# Patient Record
Sex: Female | Born: 1980 | Race: Black or African American | Hispanic: No | Marital: Single | Smoking: Never smoker
Health system: Southern US, Community
[De-identification: ages and names within clinical notes are randomized; demographics above are authoritative.]

## PROBLEM LIST (undated history)

## (undated) DIAGNOSIS — D649 Anemia, unspecified: Secondary | ICD-10-CM

## (undated) DIAGNOSIS — E079 Disorder of thyroid, unspecified: Secondary | ICD-10-CM

## (undated) DIAGNOSIS — G8929 Other chronic pain: Secondary | ICD-10-CM

## (undated) DIAGNOSIS — E538 Deficiency of other specified B group vitamins: Secondary | ICD-10-CM

## (undated) DIAGNOSIS — M419 Scoliosis, unspecified: Secondary | ICD-10-CM

## (undated) DIAGNOSIS — M549 Dorsalgia, unspecified: Secondary | ICD-10-CM

## (undated) HISTORY — PX: BACK SURGERY: SHX140

## (undated) HISTORY — DX: Scoliosis, unspecified: M41.9

---

## 2001-01-20 ENCOUNTER — Emergency Department (HOSPITAL_COMMUNITY): Admission: EM | Admit: 2001-01-20 | Discharge: 2001-01-20 | Payer: Self-pay | Admitting: Emergency Medicine

## 2001-01-20 ENCOUNTER — Encounter: Payer: Self-pay | Admitting: Emergency Medicine

## 2002-12-29 ENCOUNTER — Emergency Department (HOSPITAL_COMMUNITY): Admission: EM | Admit: 2002-12-29 | Discharge: 2002-12-29 | Payer: Self-pay | Admitting: Emergency Medicine

## 2002-12-29 ENCOUNTER — Encounter: Payer: Self-pay | Admitting: Emergency Medicine

## 2003-03-29 ENCOUNTER — Emergency Department (HOSPITAL_COMMUNITY): Admission: EM | Admit: 2003-03-29 | Discharge: 2003-03-29 | Payer: Self-pay | Admitting: Emergency Medicine

## 2004-02-12 ENCOUNTER — Other Ambulatory Visit: Admission: RE | Admit: 2004-02-12 | Discharge: 2004-02-12 | Payer: Self-pay | Admitting: Obstetrics and Gynecology

## 2004-05-04 ENCOUNTER — Emergency Department (HOSPITAL_COMMUNITY): Admission: EM | Admit: 2004-05-04 | Discharge: 2004-05-04 | Payer: Self-pay | Admitting: Emergency Medicine

## 2004-05-10 ENCOUNTER — Emergency Department (HOSPITAL_COMMUNITY): Admission: EM | Admit: 2004-05-10 | Discharge: 2004-05-10 | Payer: Self-pay | Admitting: Emergency Medicine

## 2004-05-25 ENCOUNTER — Emergency Department (HOSPITAL_COMMUNITY): Admission: EM | Admit: 2004-05-25 | Discharge: 2004-05-26 | Payer: Self-pay | Admitting: Emergency Medicine

## 2004-08-14 ENCOUNTER — Emergency Department (HOSPITAL_COMMUNITY): Admission: EM | Admit: 2004-08-14 | Discharge: 2004-08-14 | Payer: Self-pay | Admitting: Emergency Medicine

## 2004-10-27 ENCOUNTER — Ambulatory Visit (HOSPITAL_COMMUNITY): Admission: RE | Admit: 2004-10-27 | Discharge: 2004-10-27 | Payer: Self-pay | Admitting: Orthopaedic Surgery

## 2004-10-28 ENCOUNTER — Encounter (HOSPITAL_COMMUNITY): Admission: RE | Admit: 2004-10-28 | Discharge: 2004-11-27 | Payer: Self-pay | Admitting: Orthopaedic Surgery

## 2005-06-20 ENCOUNTER — Emergency Department (HOSPITAL_COMMUNITY): Admission: EM | Admit: 2005-06-20 | Discharge: 2005-06-20 | Payer: Self-pay | Admitting: Emergency Medicine

## 2005-09-21 ENCOUNTER — Emergency Department (HOSPITAL_COMMUNITY): Admission: EM | Admit: 2005-09-21 | Discharge: 2005-09-21 | Payer: Self-pay | Admitting: Emergency Medicine

## 2005-09-29 ENCOUNTER — Emergency Department (HOSPITAL_COMMUNITY): Admission: EM | Admit: 2005-09-29 | Discharge: 2005-09-29 | Payer: Self-pay | Admitting: Emergency Medicine

## 2006-04-01 ENCOUNTER — Encounter (HOSPITAL_COMMUNITY): Admission: RE | Admit: 2006-04-01 | Discharge: 2006-05-01 | Payer: Self-pay | Admitting: Family Medicine

## 2006-04-29 ENCOUNTER — Emergency Department (HOSPITAL_COMMUNITY): Admission: EM | Admit: 2006-04-29 | Discharge: 2006-04-29 | Payer: Self-pay | Admitting: Emergency Medicine

## 2006-05-20 ENCOUNTER — Encounter (HOSPITAL_COMMUNITY): Admission: RE | Admit: 2006-05-20 | Discharge: 2006-06-19 | Payer: Self-pay | Admitting: Family Medicine

## 2006-08-31 ENCOUNTER — Encounter (HOSPITAL_COMMUNITY): Admission: RE | Admit: 2006-08-31 | Discharge: 2006-09-30 | Payer: Self-pay | Admitting: Orthopaedic Surgery

## 2006-10-01 ENCOUNTER — Encounter (HOSPITAL_COMMUNITY): Admission: RE | Admit: 2006-10-01 | Discharge: 2006-10-31 | Payer: Self-pay | Admitting: Orthopaedic Surgery

## 2006-11-05 ENCOUNTER — Emergency Department (HOSPITAL_COMMUNITY): Admission: EM | Admit: 2006-11-05 | Discharge: 2006-11-05 | Payer: Self-pay | Admitting: Emergency Medicine

## 2007-06-02 ENCOUNTER — Encounter (HOSPITAL_COMMUNITY): Admission: RE | Admit: 2007-06-02 | Discharge: 2007-07-02 | Payer: Self-pay | Admitting: Family Medicine

## 2007-08-23 ENCOUNTER — Emergency Department (HOSPITAL_COMMUNITY): Admission: EM | Admit: 2007-08-23 | Discharge: 2007-08-23 | Payer: Self-pay | Admitting: Physician Assistant

## 2008-11-11 ENCOUNTER — Emergency Department (HOSPITAL_COMMUNITY): Admission: EM | Admit: 2008-11-11 | Discharge: 2008-11-11 | Payer: Self-pay | Admitting: Emergency Medicine

## 2008-12-10 ENCOUNTER — Emergency Department (HOSPITAL_COMMUNITY): Admission: EM | Admit: 2008-12-10 | Discharge: 2008-12-10 | Payer: Self-pay | Admitting: Emergency Medicine

## 2008-12-11 ENCOUNTER — Emergency Department (HOSPITAL_COMMUNITY): Admission: EM | Admit: 2008-12-11 | Discharge: 2008-12-11 | Payer: Self-pay | Admitting: Emergency Medicine

## 2009-03-12 ENCOUNTER — Emergency Department (HOSPITAL_COMMUNITY): Admission: EM | Admit: 2009-03-12 | Discharge: 2009-03-12 | Payer: Self-pay | Admitting: Emergency Medicine

## 2009-09-23 ENCOUNTER — Emergency Department (HOSPITAL_COMMUNITY): Admission: EM | Admit: 2009-09-23 | Discharge: 2009-09-23 | Payer: Self-pay | Admitting: Emergency Medicine

## 2009-11-15 ENCOUNTER — Emergency Department (HOSPITAL_COMMUNITY): Admission: EM | Admit: 2009-11-15 | Discharge: 2009-11-15 | Payer: Self-pay | Admitting: Emergency Medicine

## 2010-06-07 ENCOUNTER — Emergency Department (HOSPITAL_COMMUNITY)
Admission: EM | Admit: 2010-06-07 | Discharge: 2010-06-07 | Payer: Self-pay | Source: Home / Self Care | Admitting: Emergency Medicine

## 2010-08-09 ENCOUNTER — Emergency Department (HOSPITAL_COMMUNITY)
Admission: EM | Admit: 2010-08-09 | Discharge: 2010-08-09 | Disposition: A | Discharge status: Home / Self Care | Payer: Medicaid Other | Attending: Emergency Medicine | Admitting: Emergency Medicine

## 2010-08-09 DIAGNOSIS — H00019 Hordeolum externum unspecified eye, unspecified eyelid: Secondary | ICD-10-CM | POA: Insufficient documentation

## 2010-08-09 DIAGNOSIS — H5789 Other specified disorders of eye and adnexa: Secondary | ICD-10-CM | POA: Insufficient documentation

## 2010-08-24 LAB — URINALYSIS, ROUTINE W REFLEX MICROSCOPIC
Protein, ur: NEGATIVE mg/dL
Specific Gravity, Urine: >1.03 — ABNORMAL HIGH (ref 1.005–1.030)
pH: 6 (ref 5.0–8.0)

## 2010-08-24 LAB — DIFFERENTIAL
Eosinophils Relative: 2 % (ref 0–5)
Lymphocytes Relative: 29 % (ref 12–46)

## 2010-08-24 LAB — CBC
HCT: 38.4 % (ref 36.0–46.0)
Hemoglobin: 13 g/dL (ref 12.0–15.0)
MCV: 86.6 fL (ref 78.0–100.0)
WBC: 6.2 10*3/uL (ref 4.0–10.5)

## 2010-08-24 LAB — COMPREHENSIVE METABOLIC PANEL
ALT: <8 U/L (ref 0–35)
AST: 13 U/L (ref 0–37)
Alkaline Phosphatase: 69 U/L (ref 39–117)
BUN: 9 mg/dL (ref 6–23)
Calcium: 8.8 mg/dL (ref 8.4–10.5)
Chloride: 109 mEq/L (ref 96–112)
GFR calc Af Amer: >60 mL/min (ref >60)
Potassium: 3.8 mEq/L (ref 3.5–5.1)
Sodium: 140 mEq/L (ref 135–145)
Total Bilirubin: 0.3 mg/dL (ref 0.3–1.2)
Total Protein: 6.4 g/dL (ref 6.0–8.3)

## 2010-08-24 LAB — PREGNANCY, URINE: Preg Test, Ur: NEGATIVE

## 2010-08-24 LAB — URINE CULTURE

## 2010-08-24 LAB — URINE MICROSCOPIC-ADD ON

## 2010-08-25 LAB — DIFFERENTIAL
Basophils Absolute: 0 10*3/uL (ref 0.0–0.1)
Basophils Relative: 0 % (ref 0–1)
Eosinophils Relative: 1 % (ref 0–5)
Lymphocytes Relative: 46 % (ref 12–46)
Lymphs Abs: 2.9 10*3/uL (ref 0.7–4.0)
Neutro Abs: 3.1 10*3/uL (ref 1.7–7.7)
Neutrophils Relative %: 48 % (ref 43–77)

## 2010-08-25 LAB — URINE MICROSCOPIC-ADD ON

## 2010-08-25 LAB — URINALYSIS, ROUTINE W REFLEX MICROSCOPIC
Nitrite: NEGATIVE
Specific Gravity, Urine: 1.02 (ref 1.005–1.030)
Urobilinogen, UA: 2 mg/dL — ABNORMAL HIGH (ref 0.0–1.0)
pH: 6.5 (ref 5.0–8.0)

## 2010-08-25 LAB — WET PREP, GENITAL
Trich, Wet Prep: NONE SEEN
Yeast Wet Prep HPF POC: NONE SEEN

## 2010-08-25 LAB — CBC
Platelets: 224 10*3/uL (ref 150–400)
WBC: 6.4 10*3/uL (ref 4.0–10.5)

## 2010-08-25 LAB — GC/CHLAMYDIA PROBE AMP, GENITAL: Chlamydia, DNA Probe: NEGATIVE

## 2010-09-06 ENCOUNTER — Emergency Department (HOSPITAL_COMMUNITY)
Admission: EM | Admit: 2010-09-06 | Discharge: 2010-09-06 | Disposition: A | Discharge status: Home / Self Care | Payer: Medicaid Other | Attending: Emergency Medicine | Admitting: Emergency Medicine

## 2010-09-06 DIAGNOSIS — T148XXA Other injury of unspecified body region, initial encounter: Secondary | ICD-10-CM | POA: Insufficient documentation

## 2010-09-06 DIAGNOSIS — M4120 Other idiopathic scoliosis, site unspecified: Secondary | ICD-10-CM | POA: Insufficient documentation

## 2010-09-06 DIAGNOSIS — M25519 Pain in unspecified shoulder: Secondary | ICD-10-CM | POA: Insufficient documentation

## 2010-09-06 DIAGNOSIS — M542 Cervicalgia: Secondary | ICD-10-CM | POA: Insufficient documentation

## 2010-09-06 DIAGNOSIS — R109 Unspecified abdominal pain: Secondary | ICD-10-CM | POA: Insufficient documentation

## 2010-09-06 DIAGNOSIS — Y92410 Unspecified street and highway as the place of occurrence of the external cause: Secondary | ICD-10-CM | POA: Insufficient documentation

## 2010-09-06 LAB — URINALYSIS, ROUTINE W REFLEX MICROSCOPIC
Glucose, UA: NEGATIVE mg/dL
Nitrite: NEGATIVE
Protein, ur: NEGATIVE mg/dL
Urobilinogen, UA: 1 mg/dL (ref 0.0–1.0)

## 2010-09-06 LAB — URINE MICROSCOPIC-ADD ON

## 2011-02-10 LAB — CBC
Platelets: 259
RBC: 4.89
WBC: 6.3

## 2011-02-10 LAB — RAPID STREP SCREEN (MED CTR MEBANE ONLY): Streptococcus, Group A Screen (Direct): NEGATIVE

## 2011-04-23 ENCOUNTER — Emergency Department (HOSPITAL_COMMUNITY)
Admission: EM | Admit: 2011-04-23 | Discharge: 2011-04-23 | Disposition: A | Discharge status: Home / Self Care | Payer: Medicaid Other | Attending: Emergency Medicine | Admitting: Emergency Medicine

## 2011-04-23 ENCOUNTER — Encounter: Payer: Self-pay | Admitting: *Deleted

## 2011-04-23 DIAGNOSIS — B349 Viral infection, unspecified: Secondary | ICD-10-CM

## 2011-04-23 DIAGNOSIS — B9789 Other viral agents as the cause of diseases classified elsewhere: Secondary | ICD-10-CM | POA: Insufficient documentation

## 2011-04-23 HISTORY — DX: Anemia, unspecified: D64.9

## 2011-04-23 HISTORY — DX: Deficiency of other specified B group vitamins: E53.8

## 2011-04-23 NOTE — ED Notes (Signed)
Pt c/o flu like sx x 3 days 

## 2011-04-23 NOTE — ED Provider Notes (Signed)
History    This chart was scribed for Joya Gaskins, MD, MD by Smitty Pluck. The patient was seen in room Room/bed info not found and the patient's care was started at 4:38PM.   CSN: [id number] Arrival date & time: No admission date for patient encounter.   First MD Initiated Contact with Patient 04/23/11 1636      Chief Complaint  Patient presents with  . Generalized Body Aches  . Cough     Patient is a 30 y.o. female presenting with cough. The history is provided by the patient.  Cough   Ana Duncan is a 30 y.o. female who presents to the Emergency Department complaining of persistent moderate overall body aches with associated symptoms of fever, and cough onset 3 days. Pt has taken Nyquil for cough and fever.Pt reports not having flu shot. HPI ELEMENTS:  Location: head, abdomen  Onset: 3 days ago  Timing: constant  Quality: moderate    Associated symptoms: fever, cough     Past Medical History  Diagnosis Date  . Anemia   . Vitamin B 12 deficiency     Past Surgical History  Procedure Date  . Back surgery     History reviewed. No pertinent family history.  History  Substance Use Topics  . Smoking status: Never Smoker   . Smokeless tobacco: Not on file  . Alcohol Use: No    OB History    Grav Para Term Preterm Abortions TAB SAB Ect Mult Living                  Review of Systems  Respiratory: Positive for cough.   All other systems reviewed and are negative.   10 Systems reviewed and are negative for acute change except as noted in the HPI.  Allergies  Review of patient's allergies indicates no known allergies.  Home Medications  No current outpatient prescriptions on file.  BP 113/71  Pulse 109  Temp(Src) 99.8 F (37.7 C) (Oral)  Resp 20  Ht 5\' 5"  (1.651 m)  Wt 150 lb (68.04 kg)  BMI 24.96 kg/m2  SpO2 99%  Physical Exam  Nursing note and vitals reviewed.  CONSTITUTIONAL: Well developed/well nourished HEAD AND FACE:  Normocephalic/atraumatic EYES: EOMI/PERRL ENMT: Mucous membranes moist NECK: supple no meningeal signs SPINE:entire spine nontender CV: S1/S2 noted, no murmurs/rubs/gallops noted LUNGS: Lungs are clear to auscultation bilaterally, no apparent distress ABDOMEN: soft, nontender, no rebound or guarding GU:no cva tenderness NEURO: Pt is awake/alert, moves all extremitiesx4 EXTREMITIES: pulses normal, full ROM SKIN: warm, color normal PSYCH: no abnormalities of mood noted  ED Course  Procedures   DIAGNOSTIC STUDIES: Oxygen Saturation is 99% on room air, normal by my interpretation.    COORDINATION OF CARE:       MDM  Nursing notes reviewed and considered in documentation       I personally performed the services described in this documentation, which was scribed in my presence. The recorded information has been reviewed and considered.      Joya Gaskins, MD 04/23/11 [phone]

## 2011-07-29 ENCOUNTER — Encounter (HOSPITAL_COMMUNITY): Payer: Self-pay | Admitting: *Deleted

## 2011-07-29 ENCOUNTER — Emergency Department (HOSPITAL_COMMUNITY)
Admission: EM | Admit: 2011-07-29 | Discharge: 2011-07-30 | Disposition: A | Discharge status: Home / Self Care | Payer: Medicaid Other | Attending: Emergency Medicine | Admitting: Emergency Medicine

## 2011-07-29 ENCOUNTER — Emergency Department (HOSPITAL_COMMUNITY): Payer: Medicaid Other

## 2011-07-29 DIAGNOSIS — E079 Disorder of thyroid, unspecified: Secondary | ICD-10-CM | POA: Insufficient documentation

## 2011-07-29 DIAGNOSIS — R220 Localized swelling, mass and lump, head: Secondary | ICD-10-CM | POA: Insufficient documentation

## 2011-07-29 LAB — CBC
Hemoglobin: 12.8 g/dL (ref 12.0–15.0)
MCV: 81.8 fL (ref 78.0–100.0)
Platelets: 231 10*3/uL (ref 150–400)
RDW: 12.7 % (ref 11.5–15.5)

## 2011-07-29 LAB — BASIC METABOLIC PANEL
BUN: 10 mg/dL (ref 6–23)
Chloride: 102 mEq/L (ref 96–112)
Creatinine, Ser: 0.78 mg/dL (ref 0.50–1.10)
GFR calc non Af Amer: >90 mL/min (ref >90)
Glucose, Bld: 93 mg/dL (ref 70–99)

## 2011-07-29 LAB — DIFFERENTIAL
Basophils Absolute: 0 10*3/uL (ref 0.0–0.1)
Basophils Relative: 0 % (ref 0–1)
Eosinophils Absolute: 0.1 10*3/uL (ref 0.0–0.7)
Eosinophils Relative: 1 % (ref 0–5)
Lymphocytes Relative: 45 % (ref 12–46)
Monocytes Absolute: 0.3 10*3/uL (ref 0.1–1.0)
Monocytes Relative: 4 % (ref 3–12)
Neutro Abs: 4.2 10*3/uL (ref 1.7–7.7)

## 2011-07-29 LAB — POCT PREGNANCY, URINE: Preg Test, Ur: NEGATIVE

## 2011-07-29 MED ORDER — IOHEXOL 300 MG/ML  SOLN
80.0000 mL | Freq: Once | INTRAMUSCULAR | Status: AC | PRN
  Administered 2011-07-29: 80 mL via INTRAVENOUS

## 2011-07-29 NOTE — ED Notes (Signed)
Ant neck swelling with pain.  No injury

## 2011-07-29 NOTE — ED Provider Notes (Signed)
This chart was scribed for Benny Lennert, MD by Williemae Natter. The patient was seen in room APA01/APA01 at 10:07 PM.  CSN: [id number]  Arrival date & time 07/29/11  2128   First MD Initiated Contact with Patient 07/29/11 2205      Chief Complaint  Patient presents with  . Neck Pain    (Consider location/radiation/quality/duration/timing/severity/associated sxs/prior treatment) Patient is a 31 y.o. female presenting with neck pain. The history is provided by the patient.  Neck Pain  This is a new problem. The current episode started 3 to 5 hours ago. The problem occurs constantly. The problem has not changed since onset.The pain is associated with nothing. There has been no fever. The pain is present in the right side. The pain does not radiate. The pain is moderate. The symptoms are aggravated by bending. Pertinent negatives include no chest pain and no headaches. She has tried nothing for the symptoms.   Ana Duncan is a 31 y.o. female who presents to the Emergency Department complaining of neck pain. Pt has a knot on neck that has been swelling up for a few hours. Pt is in moderate pain. No known injury. Past Medical History  Diagnosis Date  . Anemia   . Vitamin B 12 deficiency     Past Surgical History  Procedure Date  . Back surgery     Family History  Problem Relation Age of Onset  . Diabetes Mother   . Hypertension Mother     History  Substance Use Topics  . Smoking status: Never Smoker   . Smokeless tobacco: Not on file  . Alcohol Use: No    OB History    Grav Para Term Preterm Abortions TAB SAB Ect Mult Living                  Review of Systems  Constitutional: Negative for fatigue.  HENT: Positive for neck pain. Negative for congestion, sinus pressure and ear discharge.   Eyes: Negative for discharge.  Respiratory: Negative for cough.   Cardiovascular: Negative for chest pain.  Gastrointestinal: Negative for abdominal pain and diarrhea.    Genitourinary: Negative for frequency and hematuria.  Musculoskeletal: Negative for back pain.  Skin: Negative for rash.  Neurological: Negative for seizures and headaches.  Hematological: Negative.   Psychiatric/Behavioral: Negative for hallucinations.    Allergies  Review of patient's allergies indicates no known allergies.  Home Medications  No current outpatient prescriptions on file.  BP 127/63  Pulse 73  Temp(Src) 97.9 F (36.6 C) (Oral)  Resp 20  Ht 5\' 5"  (1.651 m)  Wt 150 lb (68.04 kg)  BMI 24.96 kg/m2  SpO2 100%  Physical Exam  Nursing note and vitals reviewed. Constitutional: She is oriented to person, place, and time. She appears well-developed.  HENT:  Head: Normocephalic and atraumatic.       Mass on right anterior neck 1 cm. Some associated tenderness.  Eyes: Conjunctivae and EOM are normal. No scleral icterus.  Neck: Neck supple. No thyromegaly present.  Cardiovascular: Normal rate and regular rhythm.  Exam reveals no gallop and no friction rub.   No murmur heard. Pulmonary/Chest: No stridor. She has no wheezes. She has no rales. She exhibits no tenderness.  Abdominal: She exhibits no distension. There is no tenderness. There is no rebound.  Musculoskeletal: Normal range of motion. She exhibits no edema.  Lymphadenopathy:    She has no cervical adenopathy.  Neurological: She is oriented to person, place, and time.  Coordination normal.  Skin: No rash noted. No erythema.  Psychiatric: She has a normal mood and affect. Her behavior is normal.    ED Course  Procedures (including critical care time) DIAGNOSTIC STUDIES: Oxygen Saturation is 100% on room air, normal by my interpretation.    COORDINATION OF CARE: Medications - No data to display    Labs Reviewed - No data to display No results found.   No diagnosis found.    MDM   The chart was scribed for me under my direct supervision.  I personally performed the history, physical, and  medical decision making and all procedures in the evaluation of this patient..       Dr. Hyacinth Meeker to follow up on neck ct  Benny Lennert, MD 07/29/11 2303

## 2011-07-30 NOTE — Discharge Instructions (Signed)
Your CT scan shows that you have a large mass on your thyroid. This needs to have a biopsy to evaluate why it is there and what it is. Please call your Dr. in the morning to schedule this appointment, you must be seen within one to 2 weeks for this biopsy, return to the emergency department for any difficulty breathing, swelling or swallowing.  RESOURCE GUIDE  Dental Problems  Patients with Medicaid: [facility] [phone]                                           [phone] [address] [employer] Phone:  [phone]                                                  Phone:  [phone]  If unable to pay or uninsured, contact:  [facility] or [facility]. to become qualified for the adult dental clinic.  Chronic Pain Problems Contact [facility]  [phone] Patients need to be referred by their primary care doctor.  Insufficient Money for Medicine Contact [employer]:  call "211" or [facility] [phone].  No Primary Care Doctor Call [facility]  [phone] Other agencies that provide inexpensive medical care    [facility]  [phone]    [facility]  [phone]    [facility]  [phone]    [facility]  [phone]    [facility]  [phone]    [facility]  [phone]  Psychological Services [facility]  [phone] [facility]  [phone] [facility]   [phone] (emergency services [phone])  Substance Abuse Resources [facility]  [phone] [facility] [phone] [facility] [phone] Floyd[facility] Flock [phone] Residential & Outpatient Substance Abuse Program  [phone]  Abuse/Neglect [facility] Child Abuse Hotline 763-358-3227 [facility] Child Abuse Hotline 775-165-9383 (After Hours)  Emergency Shelter [facility] [phone]  Maternity Homes [facility] [place] [facility] [phone] [facility] [phone]  MRSA Hotline #:   507-084-5966    [facility] Resources  [facility] [place]     [employer]                          [facility] Dept. [address] [place]                       [address]      [address]  [place]                                                Cristobal Goldmann Phone:  [phone]                                   Phone:  [phone]                 Phone:  [phone]  [facility] Phone:  [phone]  [facility] Child Abuse Hotline [phone] [phone] (After Hours)

## 2011-07-30 NOTE — ED Provider Notes (Signed)
  Physical Exam  BP 111/64  Pulse 72  Temp(Src) 98.4 F (36.9 C) (Oral)  Resp 20  Ht 5\' 5"  (1.651 m)  Wt 150 lb (68.04 kg)  BMI 24.96 kg/m2  SpO2 100%  LMP 07/13/2011  Physical Exam  ED Course  Procedures  MDM Change of shift, vital signs normal, large thyroid mass, patient informed of same, will discharge home with followup instructions, no airway compromise at this time      Vida Roller, MD 07/30/11 0109

## 2011-08-03 ENCOUNTER — Other Ambulatory Visit (HOSPITAL_COMMUNITY): Payer: Self-pay | Admitting: "Endocrinology

## 2011-08-03 DIAGNOSIS — E049 Nontoxic goiter, unspecified: Secondary | ICD-10-CM

## 2011-08-06 ENCOUNTER — Other Ambulatory Visit (HOSPITAL_COMMUNITY): Payer: Self-pay | Admitting: "Endocrinology

## 2011-08-06 ENCOUNTER — Ambulatory Visit (HOSPITAL_COMMUNITY)
Admission: RE | Admit: 2011-08-06 | Discharge: 2011-08-06 | Disposition: A | Discharge status: Home / Self Care | Payer: Medicaid Other | Source: Ambulatory Visit | Attending: "Endocrinology | Admitting: "Endocrinology

## 2011-08-06 DIAGNOSIS — E049 Nontoxic goiter, unspecified: Secondary | ICD-10-CM | POA: Insufficient documentation

## 2011-08-06 NOTE — Progress Notes (Signed)
Lidocaine 2%          4mL injected 

## 2011-08-06 NOTE — Discharge Instructions (Signed)
Thyroid Biopsy The thyroid gland is a butterfly-shaped gland situated in the front of the neck. It produces hormones which affect metabolism, growth and development, and body temperature. A thyroid biopsy is a procedure in which small samples of tissue or fluid are removed from the thyroid gland or mass and examined under a microscope. This test is done to determine the cause of thyroid problems, such as infection, cancer, or other thyroid problems. There are 2 ways to obtain samples: 1. Fine needle biopsy. Samples are removed using a thin needle inserted through the skin and into the thyroid gland or mass.  2. Open biopsy. Samples are removed after a cut (incision) is made through the skin.  LET YOUR CAREGIVER KNOW ABOUT:   Allergies.   Medications taken including herbs, eye drops, over-the-counter medications, and creams.   Use of steroids (by mouth or creams).   Previous problems with anesthetics or numbing medicine.   Possibility of pregnancy, if this applies.   History of blood clots (thrombophlebitis).   History of bleeding or blood problems.   Previous surgery.   Other health problems.  RISKS AND COMPLICATIONS  Bleeding from the site. The risk of bleeding is higher if you have a bleeding disorder or are taking any blood thinning medications (anticoagulants).   Infection.   Injury to structures near the thyroid gland.  BEFORE THE PROCEDURE  This is a procedure that can be done as an outpatient. Confirm the time that you need to arrive for your procedure. Confirm whether there is a need to fast or withhold any medications. A blood sample may be done to determine your blood clotting time. Medicine may be given to help you relax (sedative). PROCEDURE Fine needle biopsy. You will be awake during the procedure. You may be asked to lie on your back with your head tipped backward to extend your neck. Let your caregiver know if you cannot tolerate the positioning. An area on your  neck will be cleansed. A needle is inserted through the skin of your neck. You may feel a mild discomfort during this procedure. You may be asked to avoid coughing, talking, swallowing, or making sounds during some portions of the procedure. The needle is withdrawn once tissue or fluid samples have been removed. Pressure may be applied to the neck to reduce swelling and ensure that bleeding has stopped. The samples will be sent for examination.  Open biopsy. You will be given general anesthesia. You will be asleep during the procedure. An incision is made in your neck. A sample of thyroid tissue or the mass is removed. The tissue sample or mass will be sent for examination. The sample or mass may be examined during the biopsy. If the sample or mass contains cancer cells, some or all of the thyroid gland may be removed. The incision is closed with stitches. AFTER THE PROCEDURE  Your recovery will be assessed and monitored. If there are no problems, as an outpatient, you should be able to go home shortly after the procedure. If you had a fine needle biopsy:  You may have soreness at the biopsy site for 1 to 2 days.  If you had an open biopsy:   You may have soreness at the biopsy site for 3 to 4 days.   You may have a hoarse voice or sore throat for 1 to 2 days.  Obtaining the Test Results It is your responsibility to obtain your test results. Do not assume everything is normal if you have   not heard from your caregiver or the medical facility. It is important for you to follow up on all of your test results. HOME CARE INSTRUCTIONS   Keeping your head raised on a pillow when you are lying down may ease biopsy site discomfort.   Supporting the back of your head and neck with both hands as you sit up from a lying position may ease biopsy site discomfort.   Only take over-the-counter or prescription medicines for pain, discomfort, or fever as directed by your caregiver.   Throat lozenges or gargling  with warm salt water may help to soothe a sore throat.  SEEK IMMEDIATE MEDICAL CARE IF:   You have severe bleeding from the biopsy site.   You have difficulty swallowing.   You have a fever.   You have increased pain, swelling, redness, or warmth at the biopsy site.   You notice pus coming from the biopsy site.   You have swollen glands (lymph nodes) in your neck.  Document Released: 03/01/2007 Document Revised: 04/23/2011 Document Reviewed: 08/01/2008 ExitCare Patient Information 2012 [employer]. 

## 2011-08-06 NOTE — Procedures (Signed)
ULTRASOUND GUIDED NEEDLE ASPIRATE BIOPSY OF THE THYROID GLAND Comparison: [Today's ultrasound and the CT of 07/29/2011.] Thyroid biopsy was thoroughly discussed with the patient and questions were answered.  The benefits, risks, alternatives, and complications were also discussed.  The patient understands and wishes to proceed with the procedure.  Written consent was obtained.    Ultrasound was performed to localize and mark an adequate site for the biopsy.  The patient was then prepped and draped in a normal sterile fashion.  Local anesthesia was provided with 1% lidocaine.  Using direct ultrasound guidance, [4] passes were made using [25  guage] needles into the nodule within the [right lobe/isthmus] of the thyroid.  Ultrasound was used to confirm needle placements on all occasions.  Specimens were sent to Pathology for analysis. Complications:  [None] Findings:  [Dominant right sided mass.] IMPRESSION: Ultrasound guided needle aspirate biopsy performed of the [right lobe/isthmic] thyroid nodule.

## 2011-08-15 ENCOUNTER — Encounter (HOSPITAL_COMMUNITY): Payer: Self-pay | Admitting: *Deleted

## 2011-08-15 ENCOUNTER — Emergency Department (HOSPITAL_COMMUNITY)
Admission: EM | Admit: 2011-08-15 | Discharge: 2011-08-15 | Disposition: A | Discharge status: Home / Self Care | Payer: Medicaid Other | Attending: Emergency Medicine | Admitting: Emergency Medicine

## 2011-08-15 DIAGNOSIS — N390 Urinary tract infection, site not specified: Secondary | ICD-10-CM

## 2011-08-15 DIAGNOSIS — D649 Anemia, unspecified: Secondary | ICD-10-CM | POA: Insufficient documentation

## 2011-08-15 DIAGNOSIS — E538 Deficiency of other specified B group vitamins: Secondary | ICD-10-CM | POA: Insufficient documentation

## 2011-08-15 LAB — URINALYSIS, ROUTINE W REFLEX MICROSCOPIC
Bilirubin Urine: NEGATIVE
Leukocytes, UA: NEGATIVE
Nitrite: NEGATIVE
Specific Gravity, Urine: >1.03 — ABNORMAL HIGH (ref 1.005–1.030)
pH: 5.5 (ref 5.0–8.0)

## 2011-08-15 LAB — URINE MICROSCOPIC-ADD ON

## 2011-08-15 LAB — PREGNANCY, URINE: Preg Test, Ur: NEGATIVE

## 2011-08-15 MED ORDER — OXYCODONE-ACETAMINOPHEN 5-325 MG PO TABS
1.0000 | ORAL_TABLET | Freq: Once | ORAL | Status: AC
  Administered 2011-08-15: 1 via ORAL
  Filled 2011-08-15: qty 1

## 2011-08-15 MED ORDER — IBUPROFEN 600 MG PO TABS: 600.0000 mg | ORAL_TABLET | Freq: Three times a day (TID) | ORAL | Status: AC | PRN

## 2011-08-15 MED ORDER — CEPHALEXIN 500 MG PO CAPS
500.0000 mg | ORAL_CAPSULE | Freq: Once | ORAL | Status: AC
  Administered 2011-08-15: 500 mg via ORAL
  Filled 2011-08-15: qty 1

## 2011-08-15 MED ORDER — CEPHALEXIN 500 MG PO CAPS: 500.0000 mg | ORAL_CAPSULE | Freq: Four times a day (QID) | ORAL | Status: DC

## 2011-08-15 NOTE — ED Notes (Signed)
Pt c/o lower abdominal pain x 1 day. No n/v or uti symptoms

## 2011-08-15 NOTE — ED Provider Notes (Signed)
History     CSN: [id number]  Arrival date & time 08/15/11  0229   First MD Initiated Contact with Patient 08/15/11 [phone]      Chief Complaint  Patient presents with  . Abdominal Pain    The history is provided by the patient.   the patient reports approximately 18 hours of suprapubic abdominal pain without nausea vomiting or diarrhea.  She denies dysuria urinary frequency.  She does report a small tinge of "pink" on the tissue after wiping upon completion of urination.  She did do sit-ups 3 days ago which is something new for her.  Nothing worsens her symptoms.  Nothing improves her symptoms.  Her symptoms are constant.  Past Medical History  Diagnosis Date  . Anemia   . Vitamin B 12 deficiency     Past Surgical History  Procedure Date  . Back surgery     Family History  Problem Relation Age of Onset  . Diabetes Mother   . Hypertension Mother     History  Substance Use Topics  . Smoking status: Never Smoker   . Smokeless tobacco: Not on file  . Alcohol Use: No    OB History    Grav Para Term Preterm Abortions TAB SAB Ect Mult Living                  Review of Systems  Gastrointestinal: Positive for abdominal pain.  All other systems reviewed and are negative.    Allergies  Review of patient's allergies indicates no known allergies.  Home Medications   Current Outpatient Rx  Name Route Sig Dispense Refill  . VITAMIN B-12 IJ Injection Inject 1 each as directed every 30 (thirty) days.    . CYCLOBENZAPRINE HCL 10 MG PO TABS Oral Take 10 mg by mouth daily as needed. For muscle spasms    . HYDROCODONE-ACETAMINOPHEN 7.5-325 MG PO TABS Oral Take 1 tablet by mouth every 6 (six) hours as needed. For pain    . CEPHALEXIN 500 MG PO CAPS Oral Take 1 capsule (500 mg total) by mouth 4 (four) times daily. 28 capsule 0  . IBUPROFEN 600 MG PO TABS Oral Take 1 tablet (600 mg total) by mouth every 8 (eight) hours as needed for pain. 15 tablet 0    BP 129/66  Pulse 87   Temp 98.8 F (37.1 C)  Resp 20  Ht 5\' 5"  (1.651 m)  Wt 145 lb (65.772 kg)  BMI 24.13 kg/m2  SpO2 100%  LMP 07/13/2011  Physical Exam  Nursing note and vitals reviewed. Constitutional: She is oriented to person, place, and time. She appears well-developed and well-nourished. No distress.  HENT:  Head: Normocephalic and atraumatic.  Eyes: EOM are normal.  Neck: Normal range of motion.  Cardiovascular: Normal rate, regular rhythm and normal heart sounds.   Pulmonary/Chest: Effort normal and breath sounds normal.  Abdominal: Soft. She exhibits no distension. There is tenderness.       Mild suprapubic tenderness without guarding or rebound.  No tenderness in the right lower quadrant  Musculoskeletal: Normal range of motion.  Neurological: She is alert and oriented to person, place, and time.  Skin: Skin is warm and dry.  Psychiatric: She has a normal mood and affect. Judgment normal.    ED Course  Procedures (including critical care time)  Labs Reviewed  URINALYSIS, ROUTINE W REFLEX MICROSCOPIC - Abnormal; Notable for the following:    Specific Gravity, Urine >1.030 (*)    Hgb urine dipstick  TRACE (*)    Protein, ur TRACE (*)    All other components within normal limits  URINE MICROSCOPIC-ADD ON - Abnormal; Notable for the following:    Squamous Epithelial / LPF MANY (*)    Bacteria, UA FEW (*)    All other components within normal limits  PREGNANCY, URINE   No results found.   1. Abdominal pain   2. Urinary tract infection       MDM  This may represent urinary tract infection with suprapubic tenderness.  Urine will be covered.  DC home with pain medicine.  I don't believe this is an early appendicitis however the family was given the possibility that this could be the presentation and then the standard return to the ER for new or worsening symptoms.  This may be simple musculoskeletal abdominal pain from her doing so several days ago.  Her abdominal exam is  unimpressive        Lyanne Co, MD 08/15/11 [phone]

## 2011-08-15 NOTE — Discharge Instructions (Signed)
Abdominal Pain  Abdominal pain can be caused by many things. Your caregiver decides the seriousness of your pain by an examination and possibly blood tests and X-rays. Many cases can be observed and treated at home. Most abdominal pain is not caused by a disease and will probably improve without treatment. However, in many cases, more time must pass before a clear cause of the pain can be found. Before that point, it may not be known if you need more testing, or if hospitalization or surgery is needed.  HOME CARE INSTRUCTIONS    Do not take laxatives unless directed by your caregiver.   Take pain medicine only as directed by your caregiver.   Only take over-the-counter or prescription medicines for pain, discomfort, or fever as directed by your caregiver.   Try a clear liquid diet (broth, tea, or water) for as long as directed by your caregiver. Slowly move to a bland diet as tolerated.  SEEK IMMEDIATE MEDICAL CARE IF:    The pain does not go away.   You have a fever.   You keep throwing up (vomiting).   The pain is felt only in portions of the abdomen. Pain in the right side could possibly be appendicitis. In an adult, pain in the left lower portion of the abdomen could be colitis or diverticulitis.   You pass bloody or black tarry stools.  MAKE SURE YOU:    Understand these instructions.   Will watch your condition.   Will get help right away if you are not doing well or get worse.  Document Released: 02/11/2005 Document Revised: 04/23/2011 Document Reviewed: 12/21/2007  ExitCare Patient Information 2012 [employer].      Urinary Tract Infection  Infections of the urinary tract can start in several places. A bladder infection (cystitis), a kidney infection (pyelonephritis), and a prostate infection (prostatitis) are different types of urinary tract infections (UTIs). They usually get better if treated with medicines (antibiotics) that kill germs. Take all the medicine until it is gone. You or your  child may feel better in a few days, but TAKE ALL MEDICINE or the infection may not respond and may become more difficult to treat.  HOME CARE INSTRUCTIONS    Drink enough water and fluids to keep the urine clear or pale yellow. Cranberry juice is especially recommended, in addition to large amounts of water.   Avoid caffeine, tea, and carbonated beverages. They tend to irritate the bladder.   Alcohol may irritate the prostate.   Only take over-the-counter or prescription medicines for pain, discomfort, or fever as directed by your caregiver.  To prevent further infections:   Empty the bladder often. Avoid holding urine for long periods of time.   After a bowel movement, women should cleanse from front to back. Use each tissue only once.   Empty the bladder before and after sexual intercourse.  FINDING OUT THE RESULTS OF YOUR TEST  Not all test results are available during your visit. If your or your child's test results are not back during the visit, make an appointment with your caregiver to find out the results. Do not assume everything is normal if you have not heard from your caregiver or the medical facility. It is important for you to follow up on all test results.  SEEK MEDICAL CARE IF:    There is back pain.   Your baby is older than 3 months with a rectal temperature of 100.5 F (38.1 C) or higher for more than   1 day.   Your or your child's problems (symptoms) are no better in 3 days. Return sooner if you or your child is getting worse.  SEEK IMMEDIATE MEDICAL CARE IF:    There is severe back pain or lower abdominal pain.   You or your child develops chills.   You have a fever.   Your baby is older than 3 months with a rectal temperature of 102 F (38.9 C) or higher.   Your baby is 3 months old or younger with a rectal temperature of 100.4 F (38 C) or higher.   There is nausea or vomiting.   There is continued burning or discomfort with urination.  MAKE SURE YOU:    Understand these  instructions.   Will watch your condition.   Will get help right away if you are not doing well or get worse.  Document Released: 02/11/2005 Document Revised: 04/23/2011 Document Reviewed: 09/16/2006  ExitCare Patient Information 2012 [employer].

## 2011-08-20 ENCOUNTER — Encounter (HOSPITAL_COMMUNITY): Payer: Self-pay | Admitting: Pharmacy Technician

## 2011-08-21 ENCOUNTER — Other Ambulatory Visit (HOSPITAL_COMMUNITY): Payer: Medicaid Other

## 2011-08-23 NOTE — H&P (Signed)
  NTS SOAP Note  Vital Signs:  Vitals as of: 08/18/2011: Systolic 111: Diastolic 76: Heart Rate 76: Temp 97.62F: Height 71ft 5in: Weight 150Lbs 0 Ounces: OFC 0in: Respiratory Rate 0: O2 Saturation 0: Pain Level 0: BMI 25  BMI : 24.96 kg/m2  Subjective: This 31 Years 8 Months old Female presents for of neck swelling.  Patient states has noted increase swelling last couple months.  Her family has told her that her neck has gotten "fat".  Has a pressure sensation in her neck.  Some PO feels as though it gets stuck.  No history of radiation exposure.  No fever or chills.  No family history.  No unusual travel.  No wt changes.  No energy changes.  NO appetite changes.  Review of Symptoms:  Constitutional:unremarkable Head:unremarkable Eyes:unremarkable as per HPI Cardiovascular:unremarkable Respiratory:unremarkable Gastrointestinal:unremarkable Genitourinary:unremarkable Musculoskeletal:unremarkable Skin:unremarkable Breast:unremarkable Hematolgic/Lymphatic:unremarkable Allergic/Immunologic:unremarkable   Past Medical History:Obtained   Past Medical History  Pregnancy Gravida: 1 Pregnancy Para: 1 Surgical History: spinal surgery Medical Problems: scoliosis Psychiatric History: none Allergies: corn Medications: B12 Shot, flexeril, hydrocodone 5/325   Social History:Obtained  Social History  Preferred Language: English ([place]) Race:  Black or African American Ethnicity: Not Hispanic / Latino Age: 31 Years 8 Months Marital Status:  S Alcohol: none Recreational drug(s): none   Smoking Status: Never smoker reviewed on 08/18/2011  Family History:Obtained   Family History  Is there a family history of:  HTN, no history of thyroid disease.    Objective Information: General:Well appearing, well nourished in no distress. Skin:no rash or prominent lesions Head:Atraumatic; no masses; no  abnormalities Eyes:conjunctiva clear, EOM intact, PERRL Mouth:Mucous membranes moist, no mucosal lesions. Neck:Supple without lymphadenopathy.Thyroid enlarged.  Non-tender.  Palpable nodularity.  No tracheal deviation.   Heart:RRR, no murmur Lungs:CTA bilaterally, no wheezes, rhonchi, rales.  Breathing unlabored. Abdomen:Soft, NT/ND, no HSM, no masses. Extremities:No deformities, clubbing, cyanosis, or edema.      Path:  FNA demonstrates atypical follicular cells.   Assessment:  Diagnosis &amp; Procedure: DiagnosisCode: 241.1, ProcedureCode: 47829,    Plan: Multinodular goiter, atypical follicular cells.  Suspicious for malignancy.  Options discussed.  Recommend Total thyroidectomy.  Patient will schedule.  She is getting married end of this week.  Will schedule in near future.  Patient Education:Alternative treatments to surgery were discussed with patient (and family).Risks and benefits  of procedure were fully explained to the patient (and family) who gave informed consent. Patient/family questions were addressed.  Follow-up:Pending Surgery                                             Active Diagnosis and Procedures: 241.1 Enlarged thyroid gland with multiple nodules (lumps)   56213 - OFFICE OUTPATIENT NEW 30 MINUTES        This note has been electronically signed by Fabio Bering MD 08/18/2011 12:21 PM

## 2011-08-24 ENCOUNTER — Encounter (HOSPITAL_COMMUNITY)
Admission: RE | Admit: 2011-08-24 | Discharge: 2011-08-24 | Disposition: A | Discharge status: Home / Self Care | Payer: Medicaid Other | Source: Ambulatory Visit | Attending: General Surgery | Admitting: General Surgery

## 2011-08-24 ENCOUNTER — Encounter (HOSPITAL_COMMUNITY): Payer: Self-pay

## 2011-08-24 LAB — DIFFERENTIAL
Eosinophils Absolute: 0.1 10*3/uL (ref 0.0–0.7)
Lymphs Abs: 2.7 10*3/uL (ref 0.7–4.0)
Monocytes Absolute: 0.3 10*3/uL (ref 0.1–1.0)
Monocytes Relative: 5 % (ref 3–12)
Neutrophils Relative %: 42 % — ABNORMAL LOW (ref 43–77)

## 2011-08-24 LAB — CBC
MCHC: 33.1 g/dL (ref 30.0–36.0)
Platelets: 254 10*3/uL (ref 150–400)
RDW: 12.5 % (ref 11.5–15.5)

## 2011-08-24 LAB — COMPREHENSIVE METABOLIC PANEL
ALT: 7 U/L (ref 0–35)
AST: 11 U/L (ref 0–37)
Albumin: 3.8 g/dL (ref 3.5–5.2)
Alkaline Phosphatase: 64 U/L (ref 39–117)
BUN: 12 mg/dL (ref 6–23)
Potassium: 3.8 mEq/L (ref 3.5–5.1)
Sodium: 140 mEq/L (ref 135–145)
Total Protein: 6.6 g/dL (ref 6.0–8.3)

## 2011-08-24 NOTE — Patient Instructions (Addendum)
20 Ana Duncan  08/24/2011   Your procedure is scheduled on:  Wednesday, April 10  Report to [facility] at [id number].  Call this number if you have problems the morning of surgery: [phone]   Remember:   Do not eat food:After Midnight.  May have clear liquids:until Midnight .  Clear liquids include soda, tea, black coffee, apple or grape juice, broth.  Take these medicines the morning of surgery with A SIP OF WATER: Flexeril, Norco   Do not wear jewelry, make-up or nail polish.  Do not wear lotions, powders, or perfumes. You may wear deodorant.  Do not shave 48 hours prior to surgery.  Do not bring valuables to the hospital.  Contacts, dentures or bridgework may not be worn into surgery.  Leave suitcase in the car. After surgery it may be brought to your room.  For patients admitted to the hospital, checkout time is 11:00 AM the day of discharge.   Patients discharged the day of surgery will not be allowed to drive home.  Name and phone number of your driver: Family  Special Instructions: CHG Shower Use Special Wash: 1/2 bottle night before surgery and 1/2 bottle morning of surgery.   Please read over the following fact sheets that you were given: Pain Booklet, Coughing and Deep Breathing, MRSA Information, Surgical Site Infection Prevention, Anesthesia Post-op Instructions and Care and Recovery After Surgery   Thyroidectomy Thyroidectomy is the removal of part or all of your thyroid gland. Your thyroid gland is a butterfly-shaped gland at the base of your neck. It produces a substance called thyroid hormone, which regulates the physical and chemical processes that keep your body functioning and make energy available to your body (metabolism). The amount of thyroid gland tissue that is removed during a thyroidectomy depends on the reason for the procedure. Typically, if only a part of your gland is removed, enough thyroid gland tissue remains to maintain normal function. If your entire  thyroid gland is removed or if the amount of thyroid gland tissue remaining is inadequate to maintain normal function, you will need life-long treatment with thyroid hormone on a daily basis. Thyroidectomy maybe performed when you have the following conditions:  Thyroid nodules. These are small, abnormal collections of tissue that form inside the thyroid gland. If these nodules begin to enlarge at a rapid rate, a sample of tissue from the nodule is taken through a needle and examined (needle biopsy). This is done to determine if the nodules are cancerous. Depending on the outcome of this exam, thyroidectomy may be necessary.   Thyroid cancer.   Goiter, which is an enlarged thyroid gland. All or part of the thyroid gland may be removed if the gland has become so large that it causes difficulty breathing or swallowing.   Hyperthyroidism. This is when the thyroid gland produces too much thyroid hormone. Hypothyroidism can cause symptoms of fluctuating weight, intolerance to heat, irritability, shortness of breath, and chest pain.  LET YOUR CAREGIVER KNOW ABOUT:   Allergies to food or medicine.   Medicines that you are taking, including vitamins, herbs, eyedrops, over-the-counter medicines, and creams.   Previous problems you have had with anesthetics or numbing medicines.   History of bleeding problems or blood clots.   Previous surgeries you have had.   Other health problems, including diabetes and kidney problems, you have had.   Possibility of pregnancy, if this applies.  BEFORE THE PROCEDURE   Do not eat or drink anything, including water, for  at least 6 hours before the procedure.   Ask your caregiver whether you should stop taking certain medicines before the day of the procedure.  PROCEDURE  There are different ways that thyroidectomy is performed. For each type, you will be given a medicine to make you sleep (general anesthetic). The three main types of thyroidectomy are listed as  follows:  Conventional thyroidectomy-A cut (incision) in the center portion of your lower neck is made with a scalpel. Muscles below your skin are separated to gain access to your thyroid gland. Your thyroid gland is dissected from your windpipe (trachea). Often a drain is placed at the incision site to drain any blood that accumulates under the skin after the procedure. This drain will be removed before you go home. The wound from the incision should heal within 2 weeks.   Endoscopic thyroidectomy-Small incisions are made in your lower neck. A small instrument (endoscope) is inserted under your skin at the incision sites. The endoscope used for thyroidectomy consists of 2 flexible tubes. Inside one of the tubes is a video camera that is used to guide the Careers adviser. Tools to remove the thyroid gland, including a tool to cut the gland (dissectors) and a suction device, are inserted through the other tube. The surgeon uses the dissectors to dissect the thyroid gland from the trachea and remove it.   Robotic thyroidectomy-This procedure allows your thyroid gland to be removed through incisions in your armpit, your chest, or high in your neck. Instruments similar to endoscopes provide a 3-dimensional picture of the surgical site. Dissecting instruments are controlled by devices similar to joysticks. These devices allow more accurate manipulation of the instruments. After the blood supply to the gland is removed, the gland is cut into several pieces and removed through the incisions.  RISKS AND COMPLICATIONS Complications associated with thyroidectomy are rare, but they can occur. Possible complications include:  A decrease in parathyroid hormone levels (hypoparathyroidism)-Your parathyroid glands are located close behind your thyroid gland. They are responsible for maintaining calcium levels inthe body. If they are damaged or removed, levels of calcium in the blood become low and nerves become irritable, which  can cause muscle spasms. Medicines are available to treat this.   Bacterial infection-This can often be treated with medicines that kill bacteria (antibiotics).   Damage to your voice box nerves-This could cause hoarseness or complete loss of voice.   Bleeding or airway obstruction.  AFTER THE PROCEDURE   You will rest in the recovery room as you wake up.   When you first wake up, your throat may feel slightly sore.   You will not be allowed to eat or drink until instructed otherwise.   You will be taken to your hospital room. You will usually stay at the hospital for 1 or 2 nights.   If a drain is placed during the procedure, it usually is removed the next day.   You may have some mild neck pain.   Your voice may be weak. This usually is temporary.  Document Released: 10/28/2000 Document Revised: 04/23/2011 Document Reviewed: 08/06/2010 [facility] Patient Information 2012 [place], [place].   PATIENT INSTRUCTIONS POST-ANESTHESIA  IMMEDIATELY FOLLOWING SURGERY:  Do not drive or operate machinery for the first twenty four hours after surgery.  Do not make any important decisions for twenty four hours after surgery or while taking narcotic pain medications or sedatives.  If you develop intractable nausea and vomiting or a severe headache please notify your doctor immediately.  FOLLOW-UP:  Please make an appointment with your surgeon as instructed. You do not need to follow up with anesthesia unless specifically instructed to do so.  WOUND CARE INSTRUCTIONS (if applicable):  Keep a dry clean dressing on the anesthesia/puncture wound site if there is drainage.  Once the wound has quit draining you may leave it open to air.  Generally you should leave the bandage intact for twenty four hours unless there is drainage.  If the epidural site drains for more than 36-48 hours please call the anesthesia department.  QUESTIONS?:  Please feel free to call your physician or the hospital operator if  you have any questions, and they will be happy to assist you.

## 2011-08-26 ENCOUNTER — Ambulatory Visit (HOSPITAL_COMMUNITY): Payer: Medicaid Other | Admitting: Anesthesiology

## 2011-08-26 ENCOUNTER — Observation Stay (HOSPITAL_COMMUNITY)
Admission: RE | Admit: 2011-08-26 | Discharge: 2011-08-27 | Disposition: A | Discharge status: Home / Self Care | Payer: Medicaid Other | Source: Ambulatory Visit | Attending: General Surgery | Admitting: General Surgery

## 2011-08-26 ENCOUNTER — Encounter (HOSPITAL_COMMUNITY)
Admission: RE | Disposition: A | Discharge status: Home / Self Care | Payer: Self-pay | Source: Ambulatory Visit | Attending: General Surgery

## 2011-08-26 ENCOUNTER — Encounter (HOSPITAL_COMMUNITY): Payer: Self-pay | Admitting: *Deleted

## 2011-08-26 ENCOUNTER — Encounter (HOSPITAL_COMMUNITY): Payer: Self-pay | Admitting: Anesthesiology

## 2011-08-26 DIAGNOSIS — Z01812 Encounter for preprocedural laboratory examination: Secondary | ICD-10-CM | POA: Insufficient documentation

## 2011-08-26 DIAGNOSIS — E042 Nontoxic multinodular goiter: Principal | ICD-10-CM | POA: Insufficient documentation

## 2011-08-26 DIAGNOSIS — Z9889 Other specified postprocedural states: Secondary | ICD-10-CM

## 2011-08-26 DIAGNOSIS — E049 Nontoxic goiter, unspecified: Secondary | ICD-10-CM | POA: Insufficient documentation

## 2011-08-26 HISTORY — PX: THYROIDECTOMY: SHX17

## 2011-08-26 LAB — CBC
MCH: 27.8 pg (ref 26.0–34.0)
MCHC: 33.7 g/dL (ref 30.0–36.0)
MCV: 82.4 fL (ref 78.0–100.0)
Platelets: 246 10*3/uL (ref 150–400)
RBC: 4.03 MIL/uL (ref 3.87–5.11)
RDW: 12.4 % (ref 11.5–15.5)

## 2011-08-26 LAB — CREATININE, SERUM: Creatinine, Ser: 0.79 mg/dL (ref 0.50–1.10)

## 2011-08-26 SURGERY — THYROIDECTOMY
Anesthesia: General | Site: Neck | Wound class: Clean

## 2011-08-26 MED ORDER — SODIUM CHLORIDE 0.9 % IJ SOLN
INTRAMUSCULAR | Status: AC
  Filled 2011-08-26: qty 10

## 2011-08-26 MED ORDER — CYCLOBENZAPRINE HCL 10 MG PO TABS: 10.0000 mg | ORAL_TABLET | Freq: Every evening | ORAL | Status: DC | PRN

## 2011-08-26 MED ORDER — ENOXAPARIN SODIUM 40 MG/0.4ML ~~LOC~~ SOLN
40.0000 mg | Freq: Once | SUBCUTANEOUS | Status: AC
  Administered 2011-08-26: 40 mg via SUBCUTANEOUS

## 2011-08-26 MED ORDER — MIDAZOLAM HCL 2 MG/2ML IJ SOLN
INTRAMUSCULAR | Status: AC
  Filled 2011-08-26: qty 2

## 2011-08-26 MED ORDER — FENTANYL CITRATE 0.05 MG/ML IJ SOLN
INTRAMUSCULAR | Status: AC
  Administered 2011-08-26: 50 ug via INTRAVENOUS
  Filled 2011-08-26: qty 2

## 2011-08-26 MED ORDER — 0.9 % SODIUM CHLORIDE (POUR BTL) OPTIME
TOPICAL | Status: DC | PRN
  Administered 2011-08-26: 1000 mL

## 2011-08-26 MED ORDER — BUPIVACAINE HCL (PF) 0.5 % IJ SOLN
INTRAMUSCULAR | Status: AC
  Filled 2011-08-26: qty 30

## 2011-08-26 MED ORDER — GLYCOPYRROLATE 0.2 MG/ML IJ SOLN
INTRAMUSCULAR | Status: AC
  Filled 2011-08-26: qty 2

## 2011-08-26 MED ORDER — ROCURONIUM BROMIDE 50 MG/5ML IV SOLN
INTRAVENOUS | Status: AC
  Filled 2011-08-26: qty 1

## 2011-08-26 MED ORDER — ROCURONIUM BROMIDE 100 MG/10ML IV SOLN
INTRAVENOUS | Status: DC | PRN
  Administered 2011-08-26: 35 mg via INTRAVENOUS

## 2011-08-26 MED ORDER — ONDANSETRON HCL 4 MG/2ML IJ SOLN
4.0000 mg | Freq: Four times a day (QID) | INTRAMUSCULAR | Status: DC | PRN
  Administered 2011-08-26: 4 mg via INTRAVENOUS
  Filled 2011-08-26 (×2): qty 2

## 2011-08-26 MED ORDER — LIDOCAINE HCL (PF) 1 % IJ SOLN
INTRAMUSCULAR | Status: AC
  Filled 2011-08-26: qty 5

## 2011-08-26 MED ORDER — LIDOCAINE HCL (CARDIAC) 10 MG/ML IV SOLN
INTRAVENOUS | Status: DC | PRN
  Administered 2011-08-26: 50 mg via INTRAVENOUS

## 2011-08-26 MED ORDER — SODIUM CHLORIDE 0.9 % IJ SOLN
INTRAMUSCULAR | Status: AC
  Administered 2011-08-26: 10 mL
  Filled 2011-08-26: qty 3

## 2011-08-26 MED ORDER — FENTANYL CITRATE 0.05 MG/ML IJ SOLN
INTRAMUSCULAR | Status: AC
  Filled 2011-08-26: qty 2

## 2011-08-26 MED ORDER — HYDROCODONE-ACETAMINOPHEN 7.5-325 MG PO TABS
1.0000 | ORAL_TABLET | ORAL | Status: DC | PRN
  Administered 2011-08-26: 2 via ORAL
  Administered 2011-08-27 (×2): 1 via ORAL
  Filled 2011-08-26: qty 2
  Filled 2011-08-26: qty 1
  Filled 2011-08-26: qty 2

## 2011-08-26 MED ORDER — CEFAZOLIN SODIUM 1-5 GM-% IV SOLN
INTRAVENOUS | Status: AC
  Filled 2011-08-26: qty 50

## 2011-08-26 MED ORDER — FENTANYL CITRATE 0.05 MG/ML IJ SOLN
25.0000 ug | INTRAMUSCULAR | Status: DC | PRN
  Administered 2011-08-26: 50 ug via INTRAVENOUS

## 2011-08-26 MED ORDER — FENTANYL CITRATE 0.05 MG/ML IJ SOLN
INTRAMUSCULAR | Status: AC
  Filled 2011-08-26: qty 5

## 2011-08-26 MED ORDER — CEFAZOLIN SODIUM 1-5 GM-% IV SOLN
INTRAVENOUS | Status: DC | PRN
  Administered 2011-08-26: 1 g via INTRAVENOUS

## 2011-08-26 MED ORDER — LACTATED RINGERS IV SOLN
INTRAVENOUS | Status: DC
  Administered 2011-08-26: 1000 mL via INTRAVENOUS

## 2011-08-26 MED ORDER — LACTATED RINGERS IV SOLN
INTRAVENOUS | Status: DC | PRN
  Administered 2011-08-26 (×2): via INTRAVENOUS

## 2011-08-26 MED ORDER — SODIUM CHLORIDE 0.9 % IJ SOLN
INTRAMUSCULAR | Status: AC
  Filled 2011-08-26: qty 3

## 2011-08-26 MED ORDER — NEOSTIGMINE METHYLSULFATE 1 MG/ML IJ SOLN
INTRAMUSCULAR | Status: DC | PRN
  Administered 2011-08-26: 4 mg via INTRAVENOUS

## 2011-08-26 MED ORDER — MIDAZOLAM HCL 2 MG/2ML IJ SOLN
1.0000 mg | INTRAMUSCULAR | Status: DC | PRN
  Administered 2011-08-26: 1 mg via INTRAVENOUS

## 2011-08-26 MED ORDER — PANTOPRAZOLE SODIUM 40 MG IV SOLR
40.0000 mg | Freq: Every day | INTRAVENOUS | Status: DC
  Administered 2011-08-26: 40 mg via INTRAVENOUS
  Filled 2011-08-26: qty 40

## 2011-08-26 MED ORDER — PROPOFOL 10 MG/ML IV BOLUS
INTRAVENOUS | Status: DC | PRN
  Administered 2011-08-26: 150 mg via INTRAVENOUS

## 2011-08-26 MED ORDER — CEFAZOLIN SODIUM 1-5 GM-% IV SOLN: 1.0000 g | INTRAVENOUS | Status: DC

## 2011-08-26 MED ORDER — ONDANSETRON HCL 4 MG/2ML IJ SOLN: 4.0000 mg | Freq: Once | INTRAMUSCULAR | Status: DC | PRN

## 2011-08-26 MED ORDER — NEOSTIGMINE METHYLSULFATE 1 MG/ML IJ SOLN
INTRAMUSCULAR | Status: AC
  Filled 2011-08-26: qty 10

## 2011-08-26 MED ORDER — GLYCOPYRROLATE 0.2 MG/ML IJ SOLN
INTRAMUSCULAR | Status: DC | PRN
  Administered 2011-08-26: 0.6 mg via INTRAVENOUS

## 2011-08-26 MED ORDER — ENOXAPARIN SODIUM 40 MG/0.4ML ~~LOC~~ SOLN
SUBCUTANEOUS | Status: AC
  Filled 2011-08-26: qty 0.4

## 2011-08-26 MED ORDER — BUPIVACAINE HCL (PF) 0.5 % IJ SOLN
INTRAMUSCULAR | Status: DC | PRN
  Administered 2011-08-26: 10 mL

## 2011-08-26 MED ORDER — GLYCOPYRROLATE 0.2 MG/ML IJ SOLN
INTRAMUSCULAR | Status: AC
  Filled 2011-08-26: qty 1

## 2011-08-26 MED ORDER — HEMOSTATIC AGENTS (NO CHARGE) OPTIME
TOPICAL | Status: DC | PRN
  Administered 2011-08-26: 1 via TOPICAL

## 2011-08-26 MED ORDER — FENTANYL CITRATE 0.05 MG/ML IJ SOLN
INTRAMUSCULAR | Status: DC | PRN
  Administered 2011-08-26 (×4): 50 ug via INTRAVENOUS
  Administered 2011-08-26: 100 ug via INTRAVENOUS
  Administered 2011-08-26 (×3): 50 ug via INTRAVENOUS

## 2011-08-26 MED ORDER — HYDROMORPHONE HCL PF 1 MG/ML IJ SOLN
1.0000 mg | INTRAMUSCULAR | Status: DC | PRN
Start: 2011-08-26 — End: 2011-08-27
  Administered 2011-08-26 (×2): 1 mg via INTRAVENOUS
  Filled 2011-08-26 (×2): qty 1

## 2011-08-26 MED ORDER — PROPOFOL 10 MG/ML IV EMUL
INTRAVENOUS | Status: AC
  Filled 2011-08-26: qty 20

## 2011-08-26 SURGICAL SUPPLY — 68 items
ADH SKN CLS APL DERMABOND .7 (GAUZE/BANDAGES/DRESSINGS)
APL SKNCLS STERI-STRIP NONHPOA (GAUZE/BANDAGES/DRESSINGS) ×1
APPLIER CLIP 11 MED OPEN (CLIP) ×4
APPLIER CLIP 9.375 SM OPEN (CLIP) ×12
APR CLP MED 11 20 MLT OPN (CLIP) ×2
APR CLP SM 9.3 20 MLT OPN (CLIP) ×6
ATTRACTOMAT 16X20 MAGNETIC DRP (DRAPES) ×2 IMPLANT
BAG HAMPER (MISCELLANEOUS) ×1 IMPLANT
BENZOIN TINCTURE PRP APPL 2/3 (GAUZE/BANDAGES/DRESSINGS) ×2 IMPLANT
BLADE SURG 15 STRL LF DISP TIS (BLADE) ×1 IMPLANT
BLADE SURG 15 STRL SS (BLADE) ×2
BLADE SURG SZ10 CARB STEEL (BLADE) ×2 IMPLANT
CLIP APPLIE 11 MED OPEN (CLIP) ×1 IMPLANT
CLIP APPLIE 9.375 SM OPEN (CLIP) ×1 IMPLANT
CLOTH BEACON ORANGE TIMEOUT ST (SAFETY) ×2 IMPLANT
COVER SURGICAL LIGHT HANDLE (MISCELLANEOUS) ×4 IMPLANT
DERMABOND ADVANCED (GAUZE/BANDAGES/DRESSINGS)
DERMABOND ADVANCED .7 DNX12 (GAUZE/BANDAGES/DRESSINGS) ×1 IMPLANT
DRAPE ORTHO 2.5IN SPLIT 77X108 (DRAPES) ×1 IMPLANT
DRAPE ORTHO SPLIT 77X108 STRL (DRAPES) ×2
DRAPE PED LAPAROTOMY (DRAPES) ×1 IMPLANT
DURAPREP 26ML APPLICATOR (WOUND CARE) ×1 IMPLANT
ELECT NDL TIP 2.8 STRL (NEEDLE) ×1 IMPLANT
ELECT NEEDLE TIP 2.8 STRL (NEEDLE) ×2 IMPLANT
ELECT REM PT RETURN 9FT ADLT (ELECTROSURGICAL) ×2
ELECTRODE REM PT RTRN 9FT ADLT (ELECTROSURGICAL) ×1 IMPLANT
FORMALIN 10 PREFIL 480ML (MISCELLANEOUS) ×1 IMPLANT
GAUZE SPONGE 4X4 16PLY XRAY LF (GAUZE/BANDAGES/DRESSINGS) ×5 IMPLANT
GLOVE BIO SURGEON STRL SZ7.5 (GLOVE) ×2 IMPLANT
GLOVE ECLIPSE 6.5 STRL STRAW (GLOVE) ×1 IMPLANT
GLOVE ECLIPSE 7.0 STRL STRAW (GLOVE) ×2 IMPLANT
GLOVE EXAM NITRILE MD LF STRL (GLOVE) ×1 IMPLANT
GLOVE INDICATOR 7.0 STRL GRN (GLOVE) ×2 IMPLANT
GLOVE SS BIOGEL STRL SZ 6.5 (GLOVE) ×1 IMPLANT
GLOVE SUPERSENSE BIOGEL SZ 6.5 (GLOVE) ×1
GOWN STRL REIN XL XLG (GOWN DISPOSABLE) ×7 IMPLANT
HEMOSTAT SURGICEL 4X8 (HEMOSTASIS) ×2 IMPLANT
KIT ROOM TURNOVER APOR (KITS) ×2 IMPLANT
MANIFOLD NEPTUNE II (INSTRUMENTS) ×2 IMPLANT
MARKER SKIN DUAL TIP RULER LAB (MISCELLANEOUS) ×1 IMPLANT
NDL HYPO 25X1 1.5 SAFETY (NEEDLE) ×1 IMPLANT
NEEDLE HYPO 25X1 1.5 SAFETY (NEEDLE) ×2 IMPLANT
NS IRRIG 1000ML POUR BTL (IV SOLUTION) ×2 IMPLANT
PACK BASIC III (CUSTOM PROCEDURE TRAY) ×2
PACK SRG BSC III STRL LF ECLPS (CUSTOM PROCEDURE TRAY) ×1 IMPLANT
PAD ARMBOARD 7.5X6 YLW CONV (MISCELLANEOUS) ×2 IMPLANT
PENCIL HANDSWITCHING (ELECTRODE) ×2 IMPLANT
SET BASIN LINEN APH (SET/KITS/TRAYS/PACK) ×2 IMPLANT
SPONGE GAUZE 2X2 8PLY STRL LF (GAUZE/BANDAGES/DRESSINGS) ×8 IMPLANT
SPONGE INTESTINAL PEANUT (DISPOSABLE) ×9 IMPLANT
STAPLER VISISTAT (STAPLE) ×2 IMPLANT
STRIP CLOSURE SKIN 1/2X4 (GAUZE/BANDAGES/DRESSINGS) ×3 IMPLANT
SUT ETHILON 3 0 FSL (SUTURE) ×1 IMPLANT
SUT ETHILON 4 0 PS 2 18 (SUTURE) IMPLANT
SUT MNCRL AB 4-0 PS2 18 (SUTURE) ×2 IMPLANT
SUT SILK 2 0 (SUTURE) ×2
SUT SILK 2-0 18XBRD TIE 12 (SUTURE) ×1 IMPLANT
SUT SILK 3 0 (SUTURE) ×2
SUT SILK 3-0 18XBRD TIE 12 (SUTURE) ×1 IMPLANT
SUT VIC AB 3-0 SH 27 (SUTURE) ×4
SUT VIC AB 3-0 SH 27X BRD (SUTURE) ×1 IMPLANT
SUT VIC AB 4-0 PS2 27 (SUTURE) ×2 IMPLANT
SYR BULB IRRIGATION 50ML (SYRINGE) ×1 IMPLANT
SYR CONTROL 10ML LL (SYRINGE) ×2 IMPLANT
SYSTEM CHEST DRAIN TLS 7FR (DRAIN) ×2 IMPLANT
TOWEL OR 17X26 4PK STRL BLUE (TOWEL DISPOSABLE) ×2 IMPLANT
WATER STERILE IRR 1000ML POUR (IV SOLUTION) ×2 IMPLANT
YANKAUER SUCT BULB TIP 10FT TU (MISCELLANEOUS) ×2 IMPLANT

## 2011-08-26 NOTE — Anesthesia Procedure Notes (Signed)
Procedure Name: Intubation Date/Time: 08/26/2011 8:46 AM Performed by: Carolyne Littles, Larrissa Stivers L Pre-anesthesia Checklist: Patient identified, Patient being monitored, Timeout performed, Emergency Drugs available and Suction available Patient Re-evaluated:Patient Re-evaluated prior to inductionOxygen Delivery Method: Circle System Utilized Preoxygenation: Pre-oxygenation with 100% oxygen Intubation Type: IV induction Ventilation: Mask ventilation without difficulty Laryngoscope Size: 3 and Miller Grade View: Grade I Tube type: Oral Tube size: 7.0 mm Number of attempts: 1 Airway Equipment and Method: stylet Placement Confirmation: ETT inserted through vocal cords under direct vision,  positive ETCO2 and breath sounds checked- equal and bilateral Secured at: 21 cm Tube secured with: Tape Dental Injury: Teeth and Oropharynx as per pre-operative assessment

## 2011-08-26 NOTE — Interval H&P Note (Signed)
History and Physical Interval Note:  08/26/2011 8:04 AM  Ana Duncan  has presented today for surgery, with the diagnosis of Goiter   The various methods of treatment have been discussed with the patient and family. After consideration of risks, benefits and other options for treatment, the patient has consented to  Procedure(s) (LRB): THYROIDECTOMY (N/A) as a surgical intervention .  The patients' history has been reviewed, patient examined, no change in status, stable for surgery.  I have reviewed the patients' chart and labs.  Questions were answered to the patient's satisfaction.     Nikcole Eischeid C

## 2011-08-26 NOTE — Progress Notes (Signed)
Resting quietly. Swallowing without difficulty. Able to say E. Speech clear. Ready for d/c to ICU, waiting on nurse to take report. Will call back.

## 2011-08-26 NOTE — Anesthesia Preprocedure Evaluation (Signed)
Anesthesia Evaluation  Patient identified by MRN, date of birth, ID band Patient awake    Reviewed: Allergy & Precautions  History of Anesthesia Complications Negative for: history of anesthetic complications  Airway Mallampati: II      Dental  (+) Teeth Intact   Pulmonary neg pulmonary ROS,  breath sounds clear to auscultation        Cardiovascular negative cardio ROS  Rhythm:Regular     Neuro/Psych    GI/Hepatic   Endo/Other    Renal/GU      Musculoskeletal   Abdominal   Peds  Hematology   Anesthesia Other Findings   Reproductive/Obstetrics                           Anesthesia Physical Anesthesia Plan  ASA: II  Anesthesia Plan: General   Post-op Pain Management:    Induction: Intravenous  Airway Management Planned: Oral ETT  Additional Equipment:   Intra-op Plan:   Post-operative Plan: Extubation in OR  Informed Consent: I have reviewed the patients History and Physical, chart, labs and discussed the procedure including the risks, benefits and alternatives for the proposed anesthesia with the patient or authorized representative who has indicated his/her understanding and acceptance.     Plan Discussed with:   Anesthesia Plan Comments:         Anesthesia Quick Evaluation

## 2011-08-26 NOTE — Progress Notes (Signed)
Cbc, creatine, calcium drawn per lab.

## 2011-08-26 NOTE — Progress Notes (Signed)
Arouses to name. Able say E without difficulty. Swallowing without difficulty.

## 2011-08-26 NOTE — Anesthesia Postprocedure Evaluation (Signed)
  Anesthesia Post-op Note  Patient: Ana Duncan  Procedure(s) Performed: Procedure(s) (LRB): THYROIDECTOMY (N/A)  Patient Location: PACU  Anesthesia Type: General  Level of Consciousness: awake, alert , oriented and patient cooperative  Airway and Oxygen Therapy: Patient Spontanous Breathing and Patient connected to face mask oxygen  Post-op Pain: none  Post-op Assessment: Post-op Vital signs reviewed, Patient's Cardiovascular Status Stable, Respiratory Function Stable and Patent Airway  Post-op Vital Signs: Reviewed and stable  Complications: No apparent anesthesia complications

## 2011-08-26 NOTE — Preoperative (Signed)
Beta Blockers   Reason not to administer Beta Blockers:Not Applicable 

## 2011-08-26 NOTE — Progress Notes (Signed)
Arouses to name. Oriented to place per nurse. Denies pain.

## 2011-08-26 NOTE — Transfer of Care (Signed)
Immediate Anesthesia Transfer of Care Note  Patient: Ana Duncan  Procedure(s) Performed: Procedure(s) (LRB): THYROIDECTOMY (N/A)  Patient Location: PACU  Anesthesia Type: General  Level of Consciousness: awake, alert , oriented and patient cooperative  Airway & Oxygen Therapy: Patient Spontanous Breathing and Patient connected to face mask oxygen  Post-op Assessment: Report given to PACU RN and Post -op Vital signs reviewed and stable  Post vital signs: Reviewed and stable  Complications: No apparent anesthesia complications

## 2011-08-27 ENCOUNTER — Encounter (HOSPITAL_COMMUNITY): Payer: Self-pay | Admitting: General Surgery

## 2011-08-27 LAB — BASIC METABOLIC PANEL
BUN: 8 mg/dL (ref 6–23)
CO2: 28 mEq/L (ref 19–32)
Calcium: 8.7 mg/dL (ref 8.4–10.5)
Chloride: 105 mEq/L (ref 96–112)
Creatinine, Ser: 0.76 mg/dL (ref 0.50–1.10)
Glucose, Bld: 93 mg/dL (ref 70–99)

## 2011-08-27 MED ORDER — HYDROCODONE-ACETAMINOPHEN 7.5-325 MG PO TABS: 1.0000 | ORAL_TABLET | ORAL | Status: DC | PRN

## 2011-08-27 MED ORDER — PROMETHAZINE HCL 12.5 MG PO TABS
12.5000 mg | ORAL_TABLET | ORAL | Status: DC | PRN
  Administered 2011-08-27: 12.5 mg via ORAL
  Filled 2011-08-27: qty 1

## 2011-08-27 NOTE — Progress Notes (Signed)
Close system drain tube discontinued by Dr Leticia Penna, patient tolerated well.Family at bedside.

## 2011-08-27 NOTE — Plan of Care (Signed)
Problem: Discharge Progression Outcomes Goal: Activity appropriate for discharge plan Outcome: Completed/Met Date Met:  08/27/11 Ambulated in hallway without difficulty. Goal: Tubes and drains discontinued if indicated Outcome: Completed/Met Date Met:  08/27/11 Midline close system drain discontinued by Dr Leticia Penna.

## 2011-08-27 NOTE — Anesthesia Postprocedure Evaluation (Signed)
  Anesthesia Post-op Note  Patient: Ana Duncan  Procedure(s) Performed: Procedure(s) (LRB): THYROIDECTOMY (N/A)  Patient Location: ICU 10  Anesthesia Type: General  Level of Consciousness: awake, alert , oriented and patient cooperative  Airway and Oxygen Therapy: Patient Spontanous Breathing  Post-op Pain: 6 /10, moderate  Post-op Assessment: Patient's Cardiovascular Status Stable, Respiratory Function Stable, Patent Airway, No signs of Nausea or vomiting and Pain level controlled  Post-op Vital Signs: Reviewed and stable  Complications: No apparent anesthesia complications

## 2011-08-27 NOTE — Progress Notes (Signed)
UR Chart Review Completed  

## 2011-08-27 NOTE — Progress Notes (Signed)
Dressing changed to surgical site,some bleeding noted. 

## 2011-08-27 NOTE — Progress Notes (Signed)
Close system midline drain tube changed this am by Alto Denver RN, tube contained 10 ml of bloody drainage.

## 2011-08-27 NOTE — Addendum Note (Signed)
Addendum  created 08/27/11 1032 by Virgia Kelner J Elyana Grabski, CRNA   Modules edited:Notes Section    

## 2011-08-27 NOTE — Progress Notes (Signed)
Discharge instructions given on medications,and follow up visits,patient verbalized understanding.Prescription sent with patient.C/O mild pain to surgical site that is controlled with medications.Accompanied by staff to an awaiting vehicle.

## 2011-08-29 NOTE — Op Note (Signed)
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 Tilford Pillar, Dr. Franky Macho  Anes:  General endotracheal  Indications:  Patient is a 31 year old female presented to my office with a history of goiter in referral from endocrinology. She had a fine-needle aspiration of a nodule within her thyroid which demonstrated atypical follicular cells. Due to both the FNA results as well as some compression symptomatology from her large goiter the risks benefits alternatives of a total thyroidectomy were discussed at length the patient including but not limited to risk of bleeding, infection, nerve injury, injury to parathyroid gland, chronic hypothyroidism were discussed with patient. Her questions and concerns are addressed the patient was consented for the planned procedure.  Procedure note:  Patient is taken to the operating room was placed in supine position the operator table time the general anesthetic is a Optician, dispensing. Once patient was asleep she is in endotracheally intubated by the nurse anesthetist. At this point a shoulder roll is placed to help extend the neck for adequate exposure. Care was taken to ensure the neck was not hyperextended. The neck was prepped with DuraPrep solution and draped in standard fashion. A collar incision was created with a 10 blade scalpel with additional dissection down to subcuticular tissue carried out using electrocautery including the division of the platysma. The anterior jugular vein was identified and while attempting to mobilize was inadvertently torn and quickly controlled with suture of ligation. Having obtained hemostasis the tissue flap was created both superiorly and inferiorly under the platysma and a retractor was placed. The raphae was identified and divided vertically with electrocautery.  At this point the thyroid was identified and the strap muscles were carefully dissected laterally off of the capsule of the thyroid gland. The thyroid gland was noted to be hypervascular. He was also noted to be quite friable and tore easily during careful dissection and medial mobilization. I did continued the dissection down to the middle thyroid vein. This was ligated with medium surgical clips. Attention was then turned inferiorly to the inferior lobe. Careful dissection exposed the inferior thyroid artery as well as the right inferior parathyroid gland. The artery was ligated with surgical clips and divided. Similar dissection was carried out with the upper right pole.  Due to the friable nature of the thyroid gland during medial mobilization the right lobe for and was freed. It was sent as a permanent specimen and labeled appropriately. A Ray-Tec sponge was then placed into the wound lateral to the trachea. At this point attention was turned to the left side.  Again the strap muscles were mobilized laterally using blunt peanut dissection. The middle thyroid vein again was identified and ligated with surgical clips divided. The inferior pole was identified as was the inferior (artery which was ligated again with surgical clips. This allowed mobilization medially and superiorly. The dissection was carried out bluntly to the trachea taking care to stay with on the capsule of the thyroid gland. The superior pole was identified and ligated again with surgical clips. The superior parathyroid gland was identified and kept out of the surgical field. This allowed mobilization of the thyroid to the trachea and electrocautery was utilized to divide the posterior attachments of the thyroid off the trachea. This is continued over to the remnants of the right thyroid gland. The residual  gland on the right side was also removed at this time with careful blunt dissection. Hemostasis was obtained with small surgical clips.  The thyroid gland was sent as a permanent specimen to pathology with both lobes. The surgical site was irrigated. Ray-Tec sponges were inserted of the bolus size along the trachea and gentle pressure was held. Hemostasis was adequate and a piece of Surgicel snow was placed in warm both sides of the trachea. Additional pressure was held momentarily and when inspection was undertaken there is no evidence of any active bleeding. A small surgical drain was placed through stab incision inferior to the collar incision. The drain was placed on both sides of the trachea. At this time attention was turned to closure.  The strap muscles were reapproximated using continuous 3-0 Vicryl suture. The platysma was also reapproximated using a 3-0 Vicryl suture in a simple interrupted fashion. The skin edges were reapproximated using a 4-0 Monocryl in a running subcuticular suture. The skin was washed dried moist dry towel. Benzoin is applied around incision. Half-inch Steri-Strips are placed. The drapes removed the patient was allowed to come out of general anesthetic was transferred to PACU in stable condition. At the conclusion of procedure all instrument, sponge, needle counts are correct. Patient tolerated the procedure extremely well. Dr. Lovell Sheehan assisted during the entirety of the case.  Complications:  None apparent  EBL:  Less than 150 ML's  Specimen:  Thyroid gland

## 2011-09-16 NOTE — Discharge Summary (Signed)
Physician Discharge Summary  Patient ID: Ana Duncan MRN: [id number] DOB/AGE: [birth year] 30 y.o.  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 *    Discharged Condition: stable  Hospital Course: Patient was admitted for planned total thyroidectomy. She was taken to the operating room and underwent a total thyroidectomy. She had an uneventful course in the PACU and was transferred to the step down unit for continued postoperative monitoring. She was advanced on a diet. Pain was controlled oral analgesia. Patient's morning laboratory results were stable and plans were made for discharge.  Consults: None  Significant Diagnostic Studies: labs: Serum calcium  Treatments: surgery: Total thyroidectomy  Discharge Exam: Blood pressure 114/65, pulse 86, temperature 97.5 F (36.4 C), temperature source Oral, resp. rate 15, height 5\' 5"  (1.651 m), weight 69 kg (152 lb 1.9 oz), last menstrual period 08/17/2011, SpO2 100.00%. General appearance: alert and no distress Eyes: Pupils equal round reactive, contractions are intact, no scleral icterus or continue pallor is noted. Neck: supple, symmetrical, trachea midline and Collar incision is clean dry and intact. Drain with minimal serosanguineous discharge. Resp: clear to auscultation bilaterally Cardio: regular rate and rhythm Extremities: Warm, moist, no edema. No paresthesias  Disposition: 01-Home or Self Care  Discharge Orders    Future Orders Please Complete By Expires   Diet - low sodium heart healthy      Increase activity slowly      Discharge instructions      Comments:   Increase activity as tolerated. May place ice pack for comfort.  Alternate an anti-inflammatory such as ibuprofen (Motrin, Advil) 400-600mg  every 6 hours with the prescribed pain  medication.   Do not take any additional acetaminophen as there is Tylenol in the pain medication.    Driving Restrictions      Comments:   No driving while on pain medications.    Discharge wound care:      Comments:   Clean surgical sites with soap and water.  May shower the morning after surgery unless instructed by Dr. Leticia Penna otherwise.  No soaking for 2-3 weeks.    If adhesive strips are in place, they may be removed in 1-2 weeks while in the shower.    Call MD for:  temperature >100.4      Call MD for:  persistant nausea and vomiting      Call MD for:  severe uncontrolled pain      Call MD for:  redness, tenderness, or signs of infection (pain, swelling, redness, odor or green/yellow discharge around incision site)      Call MD for:  difficulty breathing, headache or visual disturbances        Medication List  As of 09/16/2011  8:57 AM   STOP taking these medications         cephALEXin 500 MG capsule      ibuprofen 600 MG tablet         TAKE these medications         cyclobenzaprine 10 MG tablet   Commonly known as: FLEXERIL   Take 10 mg by mouth at bedtime as needed. For muscle spasms      HYDROcodone-acetaminophen 7.5-325 MG per tablet   Commonly known as: NORCO   Take 1 tablet by mouth every 4 (four) hours as needed. For pain      VITAMIN B-12 IJ  Inject 1 each into the muscle every 30 (thirty) days.           Follow-up Information    Follow up with Fabio Bering, MD on 09/10/2011. (appointment time at 2:45pm)    Contact information:   [address] [place] [address] [phone]          Signed: Fabio Bering 09/16/2011, 8:57 AM

## 2012-01-12 ENCOUNTER — Emergency Department (HOSPITAL_COMMUNITY)
Admission: EM | Admit: 2012-01-12 | Discharge: 2012-01-12 | Disposition: A | Discharge status: Home / Self Care | Payer: Medicaid Other | Attending: Emergency Medicine | Admitting: Emergency Medicine

## 2012-01-12 ENCOUNTER — Emergency Department (HOSPITAL_COMMUNITY): Payer: Medicaid Other

## 2012-01-12 ENCOUNTER — Encounter (HOSPITAL_COMMUNITY): Payer: Self-pay | Admitting: Emergency Medicine

## 2012-01-12 DIAGNOSIS — E079 Disorder of thyroid, unspecified: Secondary | ICD-10-CM | POA: Insufficient documentation

## 2012-01-12 DIAGNOSIS — E538 Deficiency of other specified B group vitamins: Secondary | ICD-10-CM | POA: Insufficient documentation

## 2012-01-12 DIAGNOSIS — S53409A Unspecified sprain of unspecified elbow, initial encounter: Secondary | ICD-10-CM

## 2012-01-12 DIAGNOSIS — M25529 Pain in unspecified elbow: Secondary | ICD-10-CM | POA: Insufficient documentation

## 2012-01-12 DIAGNOSIS — D649 Anemia, unspecified: Secondary | ICD-10-CM | POA: Insufficient documentation

## 2012-01-12 HISTORY — DX: Disorder of thyroid, unspecified: E07.9

## 2012-01-12 MED ORDER — NAPROXEN 500 MG PO TABS: 500.0000 mg | ORAL_TABLET | Freq: Two times a day (BID) | ORAL | Status: DC

## 2012-01-12 MED ORDER — HYDROCODONE-ACETAMINOPHEN 5-325 MG PO TABS: ORAL_TABLET | ORAL | Status: AC

## 2012-01-12 NOTE — ED Provider Notes (Signed)
History     CSN: [id number]  Arrival date & time 01/12/12  4098   First MD Initiated Contact with Patient 01/12/12 [phone]      Chief Complaint  Patient presents with  . Arm Pain    (Consider location/radiation/quality/duration/timing/severity/associated sxs/prior treatment) HPI Comments: Patient complains of sharp pains to her left elbow that began suddenly this morning when she" jerked my arm" upon waking from a dream. She describes the pain as a stabbing type pain that radiates up her arm with full extension, putting weight to her arm, oral and she tries to raise her arm over her head. She also complains of a tingling sensation to her fingertips, but states this is a chronic condition. She denies weakness or numbness of her arm, chest pain, shortness of breath, or wrist pain. She also complains of neck pain but also states this is chronic to scoliosis and placement of Harrington rods.    Patient is a 31 y.o. female presenting with arm pain. The history is provided by the patient.  Arm Pain This is a new problem. The current episode started today. The problem occurs constantly. The problem has been unchanged. Associated symptoms include arthralgias and neck pain. Pertinent negatives include no chest pain, chills, congestion, coughing, fever, headaches, joint swelling, myalgias, nausea, numbness, rash, sore throat, visual change, vomiting or weakness. The symptoms are aggravated by bending (movement and full extension). She has tried nothing for the symptoms. The treatment provided no relief.    Past Medical History  Diagnosis Date  . Anemia   . Vitamin B 12 deficiency   . Thyroid disease     Past Surgical History  Procedure Date  . Back surgery     rod placement for scoliosis  . Thyroidectomy 08/26/2011    Procedure: THYROIDECTOMY;  Surgeon: Fabio Bering, MD;  Location: [facility];  Service: General;  Laterality: N/A;  Total Thyroidectomy    Family History  Problem Relation Age of  Onset  . Diabetes Mother   . Hypertension Mother   . Anesthesia problems Neg Hx   . Hypotension Neg Hx   . Malignant hyperthermia Neg Hx   . Pseudochol deficiency Neg Hx     History  Substance Use Topics  . Smoking status: Never Smoker   . Smokeless tobacco: Not on file  . Alcohol Use: No    OB History    Grav Para Term Preterm Abortions TAB SAB Ect Mult Living                  Review of Systems  Constitutional: Negative for fever, chills and activity change.  HENT: Positive for neck pain. Negative for congestion, sore throat, facial swelling and neck stiffness.   Respiratory: Negative for cough, chest tightness and shortness of breath.   Cardiovascular: Negative for chest pain.  Gastrointestinal: Negative for nausea and vomiting.  Musculoskeletal: Positive for arthralgias. Negative for myalgias, back pain, joint swelling and gait problem.  Skin: Negative for rash.  Neurological: Negative for dizziness, facial asymmetry, weakness, numbness and headaches.  Hematological: Negative for adenopathy.  All other systems reviewed and are negative.    Allergies  Food  Home Medications   Current Outpatient Rx  Name Route Sig Dispense Refill  . VITAMIN B-12 IJ Intramuscular Inject 1 each into the muscle every 30 (thirty) days.     . CYCLOBENZAPRINE HCL 10 MG PO TABS Oral Take 10 mg by mouth at bedtime as needed. For muscle spasms    .  HYDROCODONE-ACETAMINOPHEN 7.5-325 MG PO TABS Oral Take 1 tablet by mouth every 4 (four) hours as needed. For pain 45 tablet 0    BP 112/74  Pulse 76  Temp 98.5 F (36.9 C) (Oral)  Resp 16  Ht 5\' 5"  (1.651 m)  Wt 142 lb (64.411 kg)  BMI 23.63 kg/m2  SpO2 97%  LMP 12/25/2011  Physical Exam  Nursing note and vitals reviewed. Constitutional: She is oriented to person, place, and time. She appears well-developed and well-nourished. No distress.  HENT:  Head: Normocephalic and atraumatic.  Neck: Normal range of motion.  Cardiovascular:  Normal rate, regular rhythm and normal heart sounds.   Pulmonary/Chest: Effort normal and breath sounds normal.  Musculoskeletal: She exhibits tenderness. She exhibits no edema.       Left elbow: She exhibits normal range of motion, no swelling, no effusion, no deformity and no laceration. tenderness found. Lateral epicondyle tenderness noted.       Arms:      ttp along the lateral epicondyle of the left elbow.  Radial pulse is brisk, sensation intact.  CR< 2 sec.  No bruising or deformity.  Patient has full ROM. Left wrist and shoulder are NT  Lymphadenopathy:    She has no cervical adenopathy.  Neurological: She is alert and oriented to person, place, and time. No cranial nerve deficit or sensory deficit. She exhibits normal muscle tone. Coordination and gait normal.  Reflex Scores:      Tricep reflexes are 2+ on the right side and 2+ on the left side.      Bicep reflexes are 2+ on the right side and 2+ on the left side.      Brachioradialis reflexes are 2+ on the right side and 2+ on the left side. Skin: Skin is warm and dry.    ED Course  Procedures (including critical care time)  Labs Reviewed - No data to display   Dg Elbow Complete Left  01/12/2012  *RADIOLOGY REPORT*  Clinical Data: Left elbow pain.  LEFT ELBOW - COMPLETE 3+ VIEW  Comparison: None.  Findings: No fracture, foreign body, or acute bony findings are identified.  No elbow effusion observed.  IMPRESSION:  1.  No significant abnormality identified.   Original Report Authenticated By: Dellia Cloud, M.D.      MDM  Sling applied to the left arm, pain improved, remains neurovascularly intact   Tender to palpation along the lateral epicondyle.  No soft tissue swelling, excessive warmth, or erythema. Doubtful of septic joint or olecranon bursitis.  likely sprain   Patient agrees to followup with orthopedics if the pain is not improving.  Prescribed: Naprosyn Norco #15    Aubery Douthat L. Arun Herrod, [place] 01/12/12  1044

## 2012-01-12 NOTE — ED Provider Notes (Signed)
Medical screening examination/treatment/procedure(s) were performed by non-physician practitioner and as supervising physician I was immediately available for consultation/collaboration. Devoria Albe, MD, FACEP   Ward Givens, MD 01/12/12 1100

## 2012-01-12 NOTE — ED Notes (Signed)
Pt states woke up from bad dream and jerked her arm causing pain in left elbow area and unable to extend without severe pain.

## 2012-07-06 ENCOUNTER — Encounter (HOSPITAL_COMMUNITY): Payer: Self-pay

## 2012-07-06 ENCOUNTER — Emergency Department (HOSPITAL_COMMUNITY)
Admission: EM | Admit: 2012-07-06 | Discharge: 2012-07-06 | Disposition: A | Discharge status: Home / Self Care | Payer: Medicaid Other | Attending: Emergency Medicine | Admitting: Emergency Medicine

## 2012-07-06 DIAGNOSIS — E079 Disorder of thyroid, unspecified: Secondary | ICD-10-CM | POA: Insufficient documentation

## 2012-07-06 DIAGNOSIS — Z862 Personal history of diseases of the blood and blood-forming organs and certain disorders involving the immune mechanism: Secondary | ICD-10-CM | POA: Insufficient documentation

## 2012-07-06 DIAGNOSIS — K0889 Other specified disorders of teeth and supporting structures: Secondary | ICD-10-CM

## 2012-07-06 DIAGNOSIS — K089 Disorder of teeth and supporting structures, unspecified: Secondary | ICD-10-CM | POA: Insufficient documentation

## 2012-07-06 DIAGNOSIS — Z8639 Personal history of other endocrine, nutritional and metabolic disease: Secondary | ICD-10-CM | POA: Insufficient documentation

## 2012-07-06 DIAGNOSIS — Z79899 Other long term (current) drug therapy: Secondary | ICD-10-CM | POA: Insufficient documentation

## 2012-07-06 MED ORDER — HYDROCODONE-ACETAMINOPHEN 5-325 MG PO TABS: 2.0000 | ORAL_TABLET | ORAL | Status: DC | PRN

## 2012-07-06 MED ORDER — HYDROCODONE-ACETAMINOPHEN 5-325 MG PO TABS
2.0000 | ORAL_TABLET | Freq: Once | ORAL | Status: DC
  Filled 2012-07-06: qty 2

## 2012-07-06 NOTE — ED Notes (Signed)
Severe pain to upper front tooth, states it started several days ago and saw a dentist on Monday who prescribed antibiotics but no pain meds

## 2012-07-06 NOTE — ED Notes (Signed)
Pt not in room when medication was bought to pt.

## 2012-07-06 NOTE — ED Notes (Signed)
Pt did not return to room. No one saw pt leave, no belongings in room.

## 2012-07-06 NOTE — ED Provider Notes (Signed)
History     CSN: [id number]  Arrival date & time 07/06/12  0136   First MD Initiated Contact with Patient 07/06/12 0153      Chief Complaint  Patient presents with  . Dental Pain    (Consider location/radiation/quality/duration/timing/severity/associated sxs/prior treatment) HPI Comments: Patient presents with severe dental pain.  Saw dentist today and is to have "surgery" on front tooth.  Was given antibiotics and told to take tylenol and motrin which are not helping her pain.  Patient is a 32 y.o. female presenting with tooth pain. The history is provided by the patient.  Dental PainThe primary symptoms include mouth pain. The symptoms began yesterday. The symptoms are worsening. The symptoms occur constantly.    Past Medical History  Diagnosis Date  . Anemia   . Vitamin B 12 deficiency   . Thyroid disease     Past Surgical History  Procedure Laterality Date  . Back surgery      rod placement for scoliosis  . Thyroidectomy  08/26/2011    Procedure: THYROIDECTOMY;  Surgeon: Fabio Bering, MD;  Location: [facility];  Service: General;  Laterality: N/A;  Total Thyroidectomy    Family History  Problem Relation Age of Onset  . Diabetes Mother   . Hypertension Mother   . Anesthesia problems Neg Hx   . Hypotension Neg Hx   . Malignant hyperthermia Neg Hx   . Pseudochol deficiency Neg Hx     History  Substance Use Topics  . Smoking status: Never Smoker   . Smokeless tobacco: Not on file  . Alcohol Use: No    OB History   Grav Para Term Preterm Abortions TAB SAB Ect Mult Living                  Review of Systems  All other systems reviewed and are negative.    Allergies  Food  Home Medications   Current Outpatient Rx  Name  Route  Sig  Dispense  Refill  . Cyanocobalamin (VITAMIN B-12 IJ)   Intramuscular   Inject 1 each into the muscle every 30 (thirty) days.          Marland Kitchen levothyroxine (SYNTHROID, LEVOTHROID) 150 MCG tablet   Oral   Take 150 mcg by  mouth daily.         . cyclobenzaprine (FLEXERIL) 10 MG tablet   Oral   Take 10 mg by mouth at bedtime as needed. For muscle spasms         . HYDROcodone-acetaminophen (NORCO) 7.5-325 MG per tablet   Oral   Take 1 tablet by mouth every 4 (four) hours as needed. For pain   45 tablet   0   . naproxen (NAPROSYN) 500 MG tablet   Oral   Take 1 tablet (500 mg total) by mouth 2 (two) times daily. Take with food   20 tablet   0     BP 132/91  Pulse 98  Temp(Src) 99.1 F (37.3 C) (Oral)  Ht 5\' 5"  (1.651 m)  Wt 142 lb (64.411 kg)  BMI 23.63 kg/m2  SpO2 99%  LMP 05/22/2012  Physical Exam  Nursing note and vitals reviewed. Constitutional: She is oriented to person, place, and time. She appears well-developed and well-nourished.  HENT:  Head: Normocephalic and atraumatic.  Mouth/Throat: Oropharynx is clear and moist.  The left upper front tooth appears grossly normal.  It is exquisitely tender to the touch.  No surrounding swelling or erythema.  Neurological: She is  alert and oriented to person, place, and time.  Skin: Skin is warm and dry.    ED Course  Procedures (including critical care time)  Labs Reviewed - No data to display No results found.   No diagnosis found.    MDM  Will treat with pain meds.  She was advised to call her dentist in the AM to discuss why she is in this degree of pain.        Geoffery Lyons, MD 07/06/12 [phone]

## 2012-07-06 NOTE — ED Notes (Signed)
Pt not in room at discharge. Paperwork & script given to charge nurse to call in the morning.

## 2012-09-18 ENCOUNTER — Emergency Department (HOSPITAL_COMMUNITY)
Admission: EM | Admit: 2012-09-18 | Discharge: 2012-09-18 | Disposition: A | Discharge status: Home / Self Care | Payer: Medicaid Other | Attending: Emergency Medicine | Admitting: Emergency Medicine

## 2012-09-18 ENCOUNTER — Encounter (HOSPITAL_COMMUNITY): Payer: Self-pay

## 2012-09-18 DIAGNOSIS — E538 Deficiency of other specified B group vitamins: Secondary | ICD-10-CM | POA: Insufficient documentation

## 2012-09-18 DIAGNOSIS — M542 Cervicalgia: Secondary | ICD-10-CM | POA: Insufficient documentation

## 2012-09-18 DIAGNOSIS — Z9889 Other specified postprocedural states: Secondary | ICD-10-CM | POA: Insufficient documentation

## 2012-09-18 DIAGNOSIS — G8929 Other chronic pain: Secondary | ICD-10-CM | POA: Insufficient documentation

## 2012-09-18 DIAGNOSIS — D649 Anemia, unspecified: Secondary | ICD-10-CM | POA: Insufficient documentation

## 2012-09-18 DIAGNOSIS — Z862 Personal history of diseases of the blood and blood-forming organs and certain disorders involving the immune mechanism: Secondary | ICD-10-CM | POA: Insufficient documentation

## 2012-09-18 DIAGNOSIS — Z8639 Personal history of other endocrine, nutritional and metabolic disease: Secondary | ICD-10-CM | POA: Insufficient documentation

## 2012-09-18 DIAGNOSIS — M549 Dorsalgia, unspecified: Secondary | ICD-10-CM

## 2012-09-18 DIAGNOSIS — M546 Pain in thoracic spine: Secondary | ICD-10-CM | POA: Insufficient documentation

## 2012-09-18 DIAGNOSIS — Z79899 Other long term (current) drug therapy: Secondary | ICD-10-CM | POA: Insufficient documentation

## 2012-09-18 HISTORY — DX: Other chronic pain: G89.29

## 2012-09-18 HISTORY — DX: Dorsalgia, unspecified: M54.9

## 2012-09-18 MED ORDER — ONDANSETRON 8 MG PO TBDP
8.0000 mg | ORAL_TABLET | Freq: Once | ORAL | Status: AC
  Administered 2012-09-18: 8 mg via ORAL
  Filled 2012-09-18: qty 1

## 2012-09-18 MED ORDER — HYDROCODONE-ACETAMINOPHEN 5-325 MG PO TABS: 2.0000 | ORAL_TABLET | ORAL | Status: DC | PRN

## 2012-09-18 MED ORDER — HYDROMORPHONE HCL PF 1 MG/ML IJ SOLN
1.0000 mg | Freq: Once | INTRAMUSCULAR | Status: AC
  Administered 2012-09-18: 1 mg via INTRAMUSCULAR
  Filled 2012-09-18: qty 1

## 2012-09-18 NOTE — ED Notes (Addendum)
Pt stated she stopped taking her pain med/muscle relaxer because "it gets where it doesn't work", is on hydrocodone 7.5mg .  Last dose per pt was 10pm last night.

## 2012-09-18 NOTE — ED Provider Notes (Signed)
Medical screening examination/treatment/procedure(s) were performed by non-physician practitioner and as supervising physician I was immediately available for consultation/collaboration.  Donnetta Hutching, MD 09/18/12 1630

## 2012-09-18 NOTE — ED Notes (Signed)
Pt reports back pain for 3 days, has been taking hydrocodone and her muscle relaxer -last dose 10pm last night.  Is followed by dr.keeling.

## 2012-09-18 NOTE — ED Provider Notes (Signed)
History     CSN: [id number]  Arrival date & time 09/18/12  1340   First MD Initiated Contact with Patient 09/18/12 1419      Chief Complaint  Patient presents with  . Back Pain    (Consider location/radiation/quality/duration/timing/severity/associated sxs/prior treatment) HPI Comments: Ana Duncan is a 32 y.o. Female with a history of severe scoliosis and surgical intervention with Harrington rods as a teenager.  She has chronic daily upper back pain which intermittently flares up and has been increased for the past 3 days.  She takes hydrocodone prn for pain along with flexeril for muscle spasm, her last dose taken was around 10 pm last night and was not relieving her pain.  She has taken no medications today, and has run out of her pain reliever.  She denies numbness or weakness in her upper extremities and has had no fever or chills.  Pain is worsened with palpation and with movement of her neck.     The history is provided by the patient and the spouse.    Past Medical History  Diagnosis Date  . Anemia   . Vitamin B 12 deficiency   . Thyroid disease   . Chronic back pain     Past Surgical History  Procedure Laterality Date  . Back surgery      rod placement for scoliosis  . Thyroidectomy  08/26/2011    Procedure: THYROIDECTOMY;  Surgeon: Fabio Bering, MD;  Location: [facility];  Service: General;  Laterality: N/A;  Total Thyroidectomy    Family History  Problem Relation Age of Onset  . Diabetes Mother   . Hypertension Mother   . Anesthesia problems Neg Hx   . Hypotension Neg Hx   . Malignant hyperthermia Neg Hx   . Pseudochol deficiency Neg Hx     History  Substance Use Topics  . Smoking status: Never Smoker   . Smokeless tobacco: Not on file  . Alcohol Use: No    OB History   Grav Para Term Preterm Abortions TAB SAB Ect Mult Living                  Review of Systems  Constitutional: Negative for fever and chills.  HENT: Positive for neck pain.    Respiratory: Negative for shortness of breath.   Cardiovascular: Negative for chest pain and leg swelling.  Gastrointestinal: Negative for abdominal pain, constipation and abdominal distention.  Genitourinary: Negative for dysuria, urgency, frequency, flank pain and difficulty urinating.  Musculoskeletal: Positive for back pain. Negative for joint swelling and gait problem.  Skin: Negative for rash.  Neurological: Negative for weakness and numbness.    Allergies  Food  Home Medications   Current Outpatient Rx  Name  Route  Sig  Dispense  Refill  . Cyanocobalamin (VITAMIN B-12 IJ)   Intramuscular   Inject 1 each into the muscle every 30 (thirty) days.          . cyclobenzaprine (FLEXERIL) 10 MG tablet   Oral   Take 10 mg by mouth at bedtime as needed. For muscle spasms         . HYDROcodone-acetaminophen (NORCO) 7.5-325 MG per tablet   Oral   Take 1 tablet by mouth every 4 (four) hours as needed. For pain   45 tablet   0   . levothyroxine (SYNTHROID, LEVOTHROID) 150 MCG tablet   Oral   Take 150 mcg by mouth every morning.          Marland Kitchen  HYDROcodone-acetaminophen (NORCO/VICODIN) 5-325 MG per tablet   Oral   Take 2 tablets by mouth every 4 (four) hours as needed for pain.   6 tablet   0     BP 125/74  Pulse 77  Temp(Src) 98.5 F (36.9 C) (Oral)  Resp 17  Ht 5\' 5"  (1.651 m)  Wt 143 lb (64.864 kg)  BMI 23.8 kg/m2  SpO2 100%  LMP 09/13/2012  Physical Exam  Nursing note and vitals reviewed. Constitutional: She appears well-developed and well-nourished.  HENT:  Head: Normocephalic.  Eyes: Conjunctivae are normal.  Neck: Muscular tenderness present. No spinous process tenderness present. Decreased range of motion present. No mass present.  TTP right trapezius musculature from right lateral neck,  Radiating to upper right scapula.  No cervical midline tenderness.  No erythema or rash.   Cardiovascular: Normal rate and intact distal pulses.   Pedal pulses  normal.  Pulmonary/Chest: Effort normal.  Abdominal: Soft. Bowel sounds are normal. She exhibits no distension and no mass.  Musculoskeletal: She exhibits no edema.       Cervical back: She exhibits decreased range of motion, tenderness and spasm. She exhibits no swelling and no deformity.       Lumbar back: She exhibits tenderness. She exhibits no swelling, no edema and no spasm.  Lymphadenopathy:    She has no cervical adenopathy.  Neurological: She is alert. She has normal strength. She displays no atrophy and no tremor. No sensory deficit. Gait normal.  Reflex Scores:      Patellar reflexes are 2+ on the right side and 2+ on the left side.      Achilles reflexes are 2+ on the right side and 2+ on the left side. No strength deficit noted in hip and knee flexor and extensor muscle groups.  Ankle flexion and extension intact.  Skin: Skin is warm and dry.  Well healed surgical incision along her spine.    Psychiatric: She has a normal mood and affect.    ED Course  Procedures (including critical care time)  Labs Reviewed - No data to display No results found.   1. Chronic upper back pain       MDM  PT was given dilaudid 1 mg IM,  zofran 8 odt. Pain improved from 8 to 5.  Offered pt a vicodin tablet which she deferred.  Given hydrocodone prepack,  Advised to take once she gets home (husband driving).  She is scheduled to see pcp in 2 days.  Encouraged to keep this appt.  Also recommended heating pad 20 minutes several times daily.  No neuro deficit on exam or by history to suggest emergent or surgical presentation.  Also discussed worsened sx that should prompt immediate re-evaluation including distal weakness, worse pain, fever.               Burgess Amor, PA-C 09/18/12 [phone]

## 2012-09-18 NOTE — ED Notes (Signed)
Pt was given pre filled prescription of Hydrocodone, 6 tablets per Burgess Amor, PA.

## 2012-11-01 ENCOUNTER — Encounter (HOSPITAL_COMMUNITY): Payer: Self-pay

## 2012-11-01 ENCOUNTER — Emergency Department (HOSPITAL_COMMUNITY)
Admission: EM | Admit: 2012-11-01 | Discharge: 2012-11-01 | Disposition: A | Discharge status: Home / Self Care | Payer: MEDICAID | Attending: Emergency Medicine | Admitting: Emergency Medicine

## 2012-11-01 ENCOUNTER — Emergency Department (HOSPITAL_COMMUNITY): Payer: MEDICAID

## 2012-11-01 DIAGNOSIS — Z79899 Other long term (current) drug therapy: Secondary | ICD-10-CM | POA: Insufficient documentation

## 2012-11-01 DIAGNOSIS — E079 Disorder of thyroid, unspecified: Secondary | ICD-10-CM | POA: Insufficient documentation

## 2012-11-01 DIAGNOSIS — R209 Unspecified disturbances of skin sensation: Secondary | ICD-10-CM | POA: Insufficient documentation

## 2012-11-01 DIAGNOSIS — F430 Acute stress reaction: Secondary | ICD-10-CM | POA: Insufficient documentation

## 2012-11-01 DIAGNOSIS — M549 Dorsalgia, unspecified: Secondary | ICD-10-CM | POA: Insufficient documentation

## 2012-11-01 DIAGNOSIS — Z862 Personal history of diseases of the blood and blood-forming organs and certain disorders involving the immune mechanism: Secondary | ICD-10-CM | POA: Insufficient documentation

## 2012-11-01 DIAGNOSIS — F410 Panic disorder [episodic paroxysmal anxiety] without agoraphobia: Secondary | ICD-10-CM

## 2012-11-01 DIAGNOSIS — R071 Chest pain on breathing: Secondary | ICD-10-CM | POA: Insufficient documentation

## 2012-11-01 DIAGNOSIS — G8929 Other chronic pain: Secondary | ICD-10-CM | POA: Insufficient documentation

## 2012-11-01 DIAGNOSIS — E538 Deficiency of other specified B group vitamins: Secondary | ICD-10-CM | POA: Insufficient documentation

## 2012-11-01 LAB — CBC WITH DIFFERENTIAL/PLATELET
Basophils Relative: 0 % (ref 0–1)
Eosinophils Absolute: 0 10*3/uL (ref 0.0–0.7)
Eosinophils Relative: 1 % (ref 0–5)
Hemoglobin: 12.3 g/dL (ref 12.0–15.0)
Lymphs Abs: 2.3 10*3/uL (ref 0.7–4.0)
MCH: 27.2 pg (ref 26.0–34.0)
MCHC: 33.6 g/dL (ref 30.0–36.0)
MCV: 80.8 fL (ref 78.0–100.0)
Monocytes Relative: 5 % (ref 3–12)
Neutrophils Relative %: 47 % (ref 43–77)
Platelets: 231 10*3/uL (ref 150–400)
RBC: 4.53 MIL/uL (ref 3.87–5.11)

## 2012-11-01 LAB — BASIC METABOLIC PANEL
BUN: 15 mg/dL (ref 6–23)
Calcium: 9 mg/dL (ref 8.4–10.5)
GFR calc Af Amer: >90 mL/min (ref >90)
GFR calc non Af Amer: >90 mL/min (ref >90)
Glucose, Bld: 99 mg/dL (ref 70–99)
Potassium: 3.3 mEq/L — ABNORMAL LOW (ref 3.5–5.1)

## 2012-11-01 MED ORDER — LORAZEPAM 0.5 MG PO TABS: ORAL_TABLET | ORAL | Status: DC

## 2012-11-01 NOTE — ED Provider Notes (Signed)
History     CSN: [id number]  Arrival date & time 11/01/12  1144   First MD Initiated Contact with Patient 11/01/12 1212      Chief Complaint  Patient presents with  . Panic Attack    (Consider location/radiation/quality/duration/timing/severity/associated sxs/prior treatment) HPI Comments: Pt reports her grandfather is in critical condition. She admits she is not handling his illness well. She has been having problem sleeping, but today had episodes in which she felt she could not catch her breath and began having pain in her chest. She also reports some tingling around the lips. She feels she may be having a panic episode, but wanted to be checked for her heart and treated for anxiety. Pt denies SI or HI.   Past Medical History  Diagnosis Date  . Anemia   . Vitamin B 12 deficiency   . Thyroid disease   . Chronic back pain     Past Surgical History  Procedure Laterality Date  . Back surgery      rod placement for scoliosis  . Thyroidectomy  08/26/2011    Procedure: THYROIDECTOMY;  Surgeon: Fabio Bering, MD;  Location: [facility];  Service: General;  Laterality: N/A;  Total Thyroidectomy    Family History  Problem Relation Age of Onset  . Diabetes Mother   . Hypertension Mother   . Anesthesia problems Neg Hx   . Hypotension Neg Hx   . Malignant hyperthermia Neg Hx   . Pseudochol deficiency Neg Hx     History  Substance Use Topics  . Smoking status: Never Smoker   . Smokeless tobacco: Not on file  . Alcohol Use: No    OB History   Grav Para Term Preterm Abortions TAB SAB Ect Mult Living                  Review of Systems  Constitutional: Negative for activity change.       All ROS Neg except as noted in HPI  HENT: Negative for nosebleeds and neck pain.   Eyes: Negative for photophobia and discharge.  Respiratory: Negative for cough, shortness of breath and wheezing.   Cardiovascular: Negative for chest pain and palpitations.  Gastrointestinal: Negative for  abdominal pain and blood in stool.  Genitourinary: Negative for dysuria, frequency and hematuria.  Musculoskeletal: Positive for back pain. Negative for arthralgias.  Skin: Negative.   Neurological: Negative for dizziness, seizures and speech difficulty.  Psychiatric/Behavioral: Negative for hallucinations and confusion. The patient is nervous/anxious.     Allergies  Food  Home Medications   Current Outpatient Rx  Name  Route  Sig  Dispense  Refill  . Cyanocobalamin (VITAMIN B-12 IJ)   Intramuscular   Inject 1 each into the muscle every 30 (thirty) days.          Marland Kitchen HYDROcodone-acetaminophen (NORCO) 7.5-325 MG per tablet   Oral   Take 1 tablet by mouth every 4 (four) hours as needed. For pain   45 tablet   0   . levothyroxine (SYNTHROID, LEVOTHROID) 125 MCG tablet   Oral   Take 125 mcg by mouth daily before breakfast.         . LORazepam (ATIVAN) 0.5 MG tablet      1 or 2 po tid for anxiety   15 tablet   0     BP 135/78  Pulse 98  Temp(Src) 99.3 F (37.4 C) (Oral)  Resp 18  Ht 5\' 5"  (1.651 m)  Wt 140 lb (63.504  kg)  BMI 23.3 kg/m2  SpO2 99%  LMP 10/24/2012  Physical Exam  Nursing note and vitals reviewed. Constitutional: She is oriented to person, place, and time. She appears well-developed and well-nourished.  Non-toxic appearance.  Pt is tearful upon my arrival.  HENT:  Head: Normocephalic.  Right Ear: Tympanic membrane and external ear normal.  Left Ear: Tympanic membrane and external ear normal.  Eyes: EOM and lids are normal. Pupils are equal, round, and reactive to light.  Neck: Normal range of motion. Neck supple. Carotid bruit is not present.  Cardiovascular: Normal rate, regular rhythm, normal heart sounds, intact distal pulses and normal pulses.   Pulmonary/Chest: Breath sounds normal. No respiratory distress.  Mild chest wall pain. No crepitus.  Abdominal: Soft. Bowel sounds are normal. There is no tenderness. There is no guarding.   Musculoskeletal: Normal range of motion.  Lymphadenopathy:       Head (right side): No submandibular adenopathy present.       Head (left side): No submandibular adenopathy present.    She has no cervical adenopathy.  Neurological: She is alert and oriented to person, place, and time. She has normal strength. No cranial nerve deficit or sensory deficit.  Skin: Skin is warm and dry.  Psychiatric: She has a normal mood and affect. Her speech is normal.    ED Course  Procedures (including critical care time)  Labs Reviewed  CBC WITH DIFFERENTIAL - Abnormal; Notable for the following:    Lymphocytes Relative 48 (*)    All other components within normal limits  BASIC METABOLIC PANEL - Abnormal; Notable for the following:    Potassium 3.3 (*)    All other components within normal limits  TROPONIN I   Dg Chest 2 View  11/01/2012   *RADIOLOGY REPORT*  Clinical Data:   chest pain; panic attack  CHEST - 2 VIEW  Comparison: None.  Findings:  Lungs are clear.  Heart size and pulmonary vascularity are normal.  No adenopathy.  There is rod fixation for thoracic and lumbar scoliosis.  There are surgical clips in the thyroid region. No pneumothorax.  IMPRESSION: Postoperative change for scoliosis.  Clips in thyroid region. Lungs clear.   Original Report Authenticated By: Bretta Bang, M.D.    Date: 11/01/2012  Rate: 84  Rhythm: normal sinus rhythm and sinus arrhythmia  QRS Axis: normal  Intervals: normal  ST/T Wave abnormalities: normal  Conduction Disutrbances:none  Narrative Interpretation: No STEMI  Old EKG Reviewed: none available   1. Panic attack as reaction to stress       MDM  I have reviewed nursing notes, vital signs, and all appropriate lab and imaging results for this patient. Pt much more calm after being in the  ED and receiving ativan. Vital signs wnl except for temp of 99.3. EKG non acute. Troponin neg for MI. Potassium 3.3, PO potassium given. Pt given information on  foods high in potassium. Pt given Rx for ativan, and advised to see MD at [facility] for additional assistance. Pt continues to deny SI or HI.       Kathie Dike, PA-C 11/10/12 [phone]

## 2012-11-01 NOTE — ED Notes (Signed)
Patient with no complaints at this time. Respirations even and unlabored. Skin warm/dry. Discharge instructions reviewed with patient at this time. Patient given opportunity to voice concerns/ask questions. I Patient discharged at this time and left Emergency Department with steady gait.   

## 2012-11-01 NOTE — ED Notes (Signed)
Pt reports is very emotional because her grandfather is in critical condition.  Pt tearful.  Says every few minutes it is hard to catch her breath and is having pain in her chest.  Pt says just wants to know if it is anxiety of if there is something else wrong.

## 2012-11-10 NOTE — ED Provider Notes (Addendum)
Medical screening examination/treatment/procedure(s) were performed by non-physician practitioner and as supervising physician I was immediately available for consultation/collaboration.  Donnetta Hutching, MD 11/10/12 1344   Medical screening examination/treatment/procedure(s) were performed by non-physician practitioner and as supervising physician I was immediately available for consultation/collaboration.  Donnetta Hutching, MD 11/10/12 1555

## 2013-01-08 IMAGING — US US THYROID BIOPSY
1 series · 9 of 9 positions shown · non-contrast
Comparison: Today's ultrasound and the CT of 07/29/2011.

CLINICAL DATA: Dominant right sided and isthmic mass.

ULTRASOUND GUIDED NEEDLE ASPIRATE BIOPSY OF THE THYROID GLAND

[Series 1: us thyroid biopsy · 0.07mm/px · 9 acquisitions, 9 frames shown]
[im 1/9]
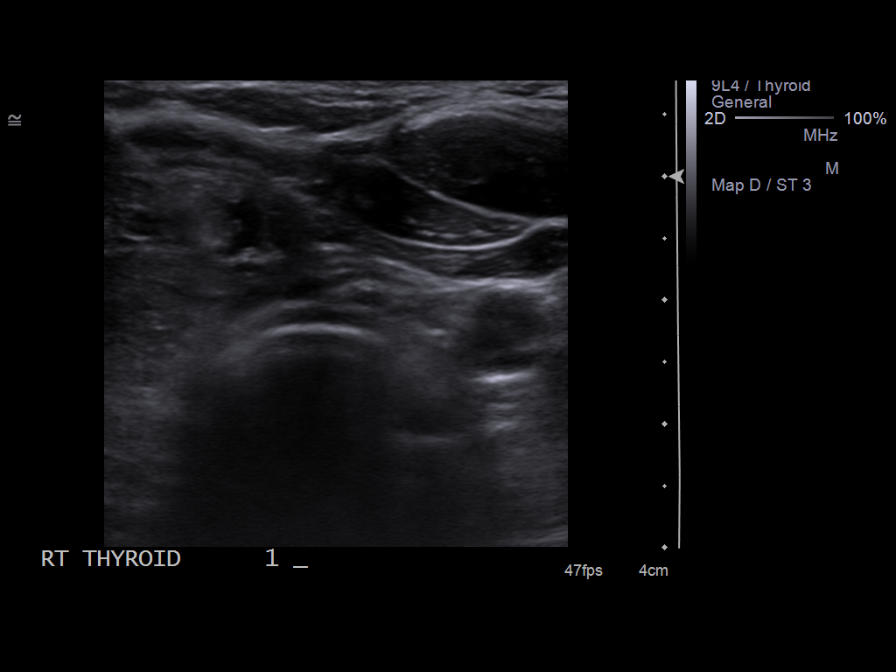
[im 2/9]
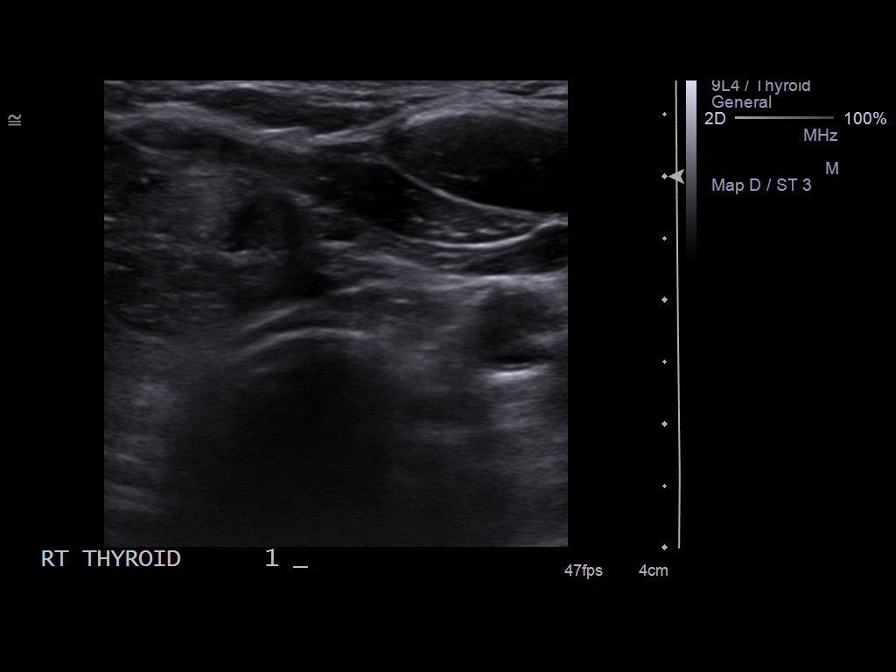
[im 3/9]
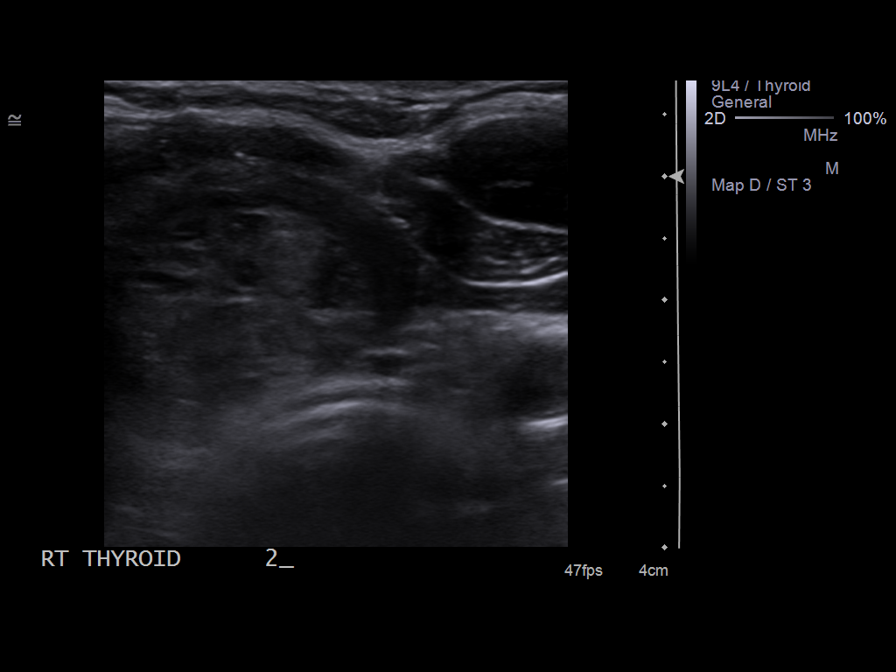
[im 4/9]
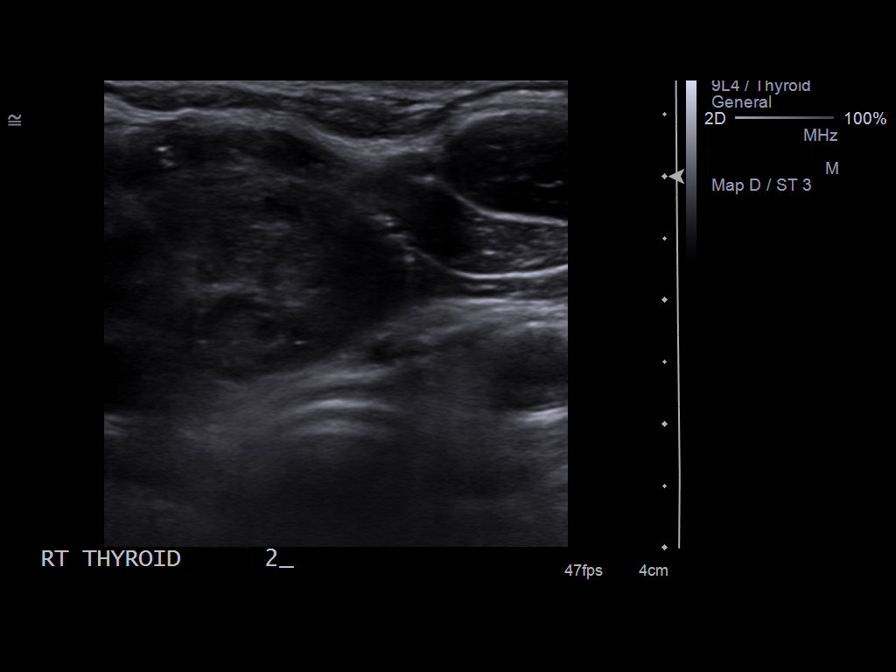
[im 5/9]
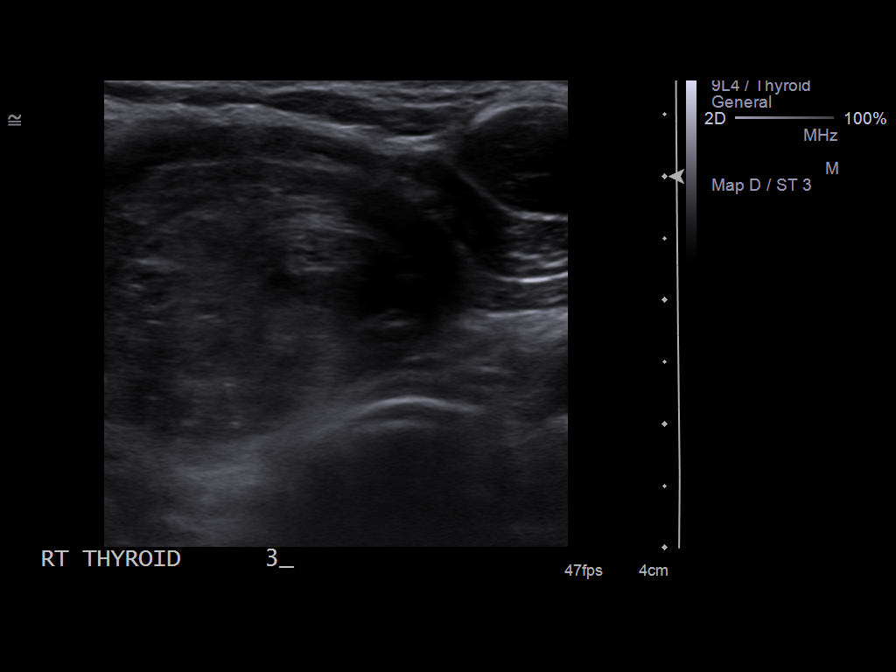
[im 6/9]
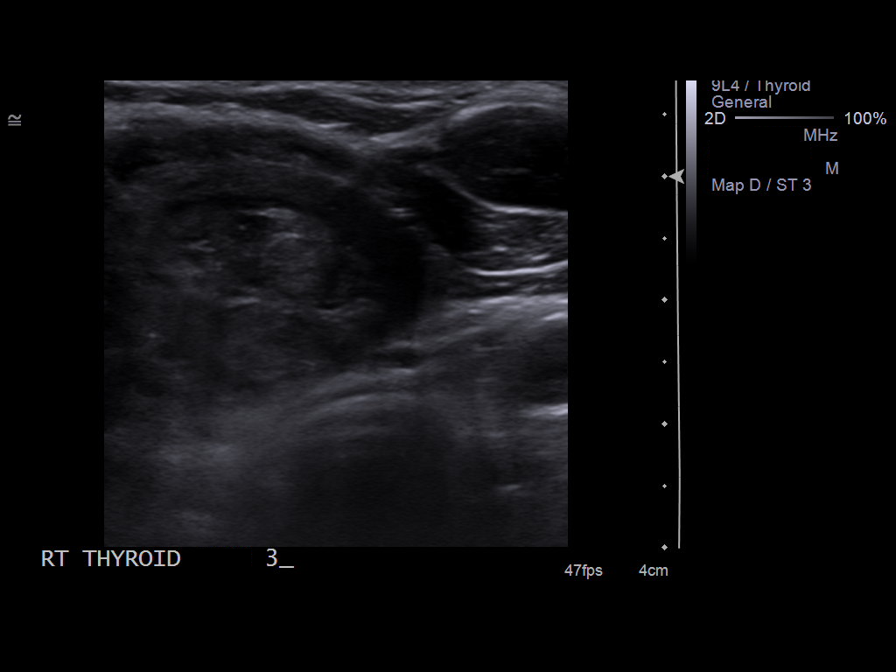
[im 7/9]
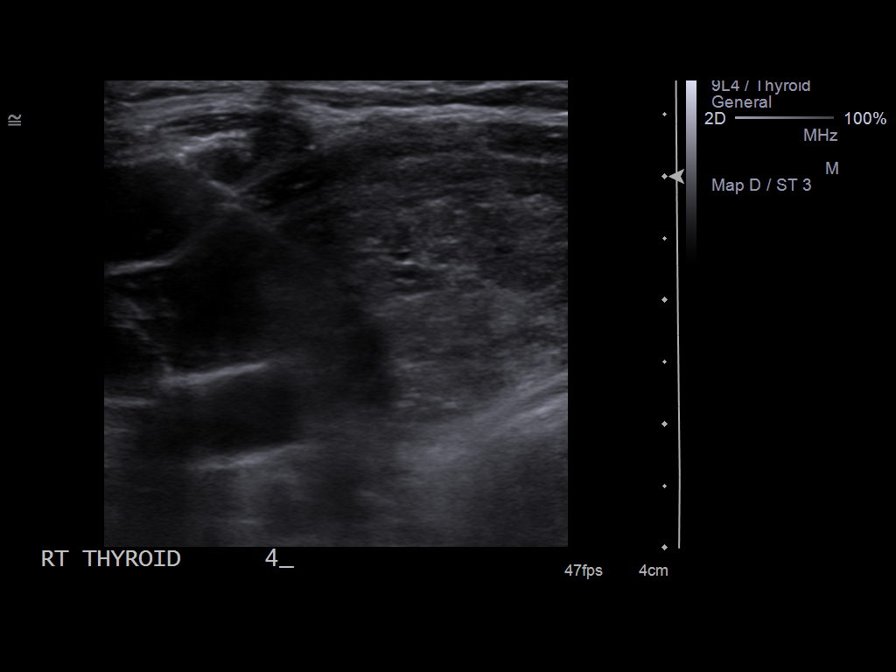
[im 8/9]
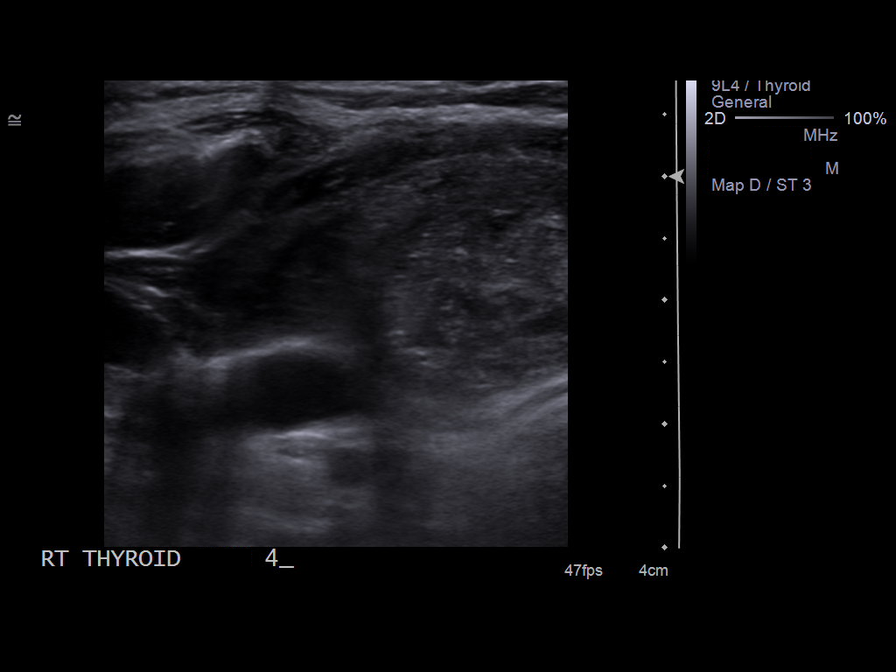
[im 9/9]
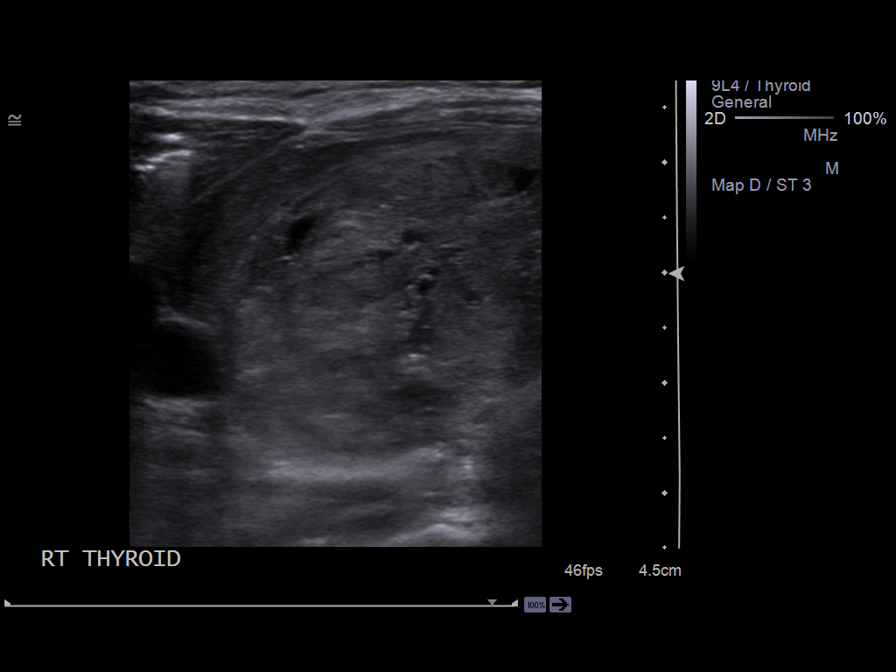

[9 of 9 positions shown; findings below may reference images not displayed]

Thyroid biopsy was thoroughly discussed with the patient and
questions were answered.  The benefits, risks, alternatives, and
complications were also discussed.  The patient understands and
wishes to proceed with the procedure.  Written consent was
obtained.

Ultrasound was performed to localize and mark an adequate site for
the biopsy.  The patient was then prepped and draped in a normal
sterile fashion.  Local anesthesia was provided with 1% lidocaine.
Using direct ultrasound guidance, 4 passes were made using 25
Rhommel Liliana into the nodule within the right lobe/isthmus of the
thyroid.  Ultrasound was used to confirm needle placements on all
occasions.  Specimens were sent to Pathology for analysis.

Complications:  None
FINDINGS: Dominant right sided mass.
IMPRESSION: Ultrasound guided needle aspirate biopsy performed of the right
lobe/isthmic thyroid nodule.

## 2015-12-23 ENCOUNTER — Ambulatory Visit: Payer: Self-pay | Attending: Internal Medicine | Admitting: *Deleted

## 2015-12-23 ENCOUNTER — Encounter: Payer: Self-pay | Admitting: *Deleted

## 2015-12-23 ENCOUNTER — Encounter (INDEPENDENT_AMBULATORY_CARE_PROVIDER_SITE_OTHER): Payer: Self-pay

## 2015-12-23 ENCOUNTER — Ambulatory Visit
Admission: RE | Admit: 2015-12-23 | Discharge: 2015-12-23 | Disposition: A | Discharge status: Home / Self Care | Payer: Self-pay | Source: Ambulatory Visit | Attending: Oncology | Admitting: Oncology

## 2015-12-23 ENCOUNTER — Ambulatory Visit
Admission: RE | Admit: 2015-12-23 | Discharge: 2015-12-23 | Disposition: A | Discharge status: Home / Self Care | Payer: Self-pay | Source: Ambulatory Visit

## 2015-12-23 VITALS — BP 124/70 | HR 76 | Temp 99.3°F | Ht 65.35 in | Wt 163.3 lb

## 2015-12-23 DIAGNOSIS — N6001 Solitary cyst of right breast: Secondary | ICD-10-CM | POA: Insufficient documentation

## 2015-12-23 DIAGNOSIS — N63 Unspecified lump in unspecified breast: Secondary | ICD-10-CM

## 2015-12-23 DIAGNOSIS — N6002 Solitary cyst of left breast: Secondary | ICD-10-CM | POA: Insufficient documentation

## 2015-12-23 NOTE — Patient Instructions (Signed)
Gave patient hand-out, Women Staying Healthy, Active and Well from [facility], with education on breast health, pap smears, heart and colon health. 

## 2015-12-23 NOTE — Progress Notes (Addendum)
Subjective:     Patient ID: Ana FitchDebbie M Cronce, female   DOB: 1981-03-29, 35 y.o.   MRN: [id number]  HPI   Review of Systems     Objective:   Physical Exam  Pulmonary/Chest: Right breast exhibits mass. Right breast exhibits no inverted nipple, no nipple discharge, no skin change and no tenderness. Left breast exhibits mass. Left breast exhibits no inverted nipple, no nipple discharge, no skin change and no tenderness. Breasts are symmetrical.         Assessment:     35 year old Black female referred to [facility] by Sharlene DoryVineetha Joelin, FNP-C [facility] for further evaluation of bilateral breast massess.  Patient states she the felt the right breast mass about 3 months ago and didn't follow-up.  States she thought it would go away.  Patient also has a history of thyroidectomy for benign mass.  Patient was seen on 11/11/15 for [facility] exam [facility] at which time bilateral masses were palpated.  On clinical breast exam today, I can palpate multiple breast masses in bilateral breast.  With patient in supine position, there is an approximate 4X3 cm lobulated firm, mobile mass at 11:30 right breast a the edge of the areola, an approximate 2 cm mass at 1:00 left breast 3 cm from the areola, an approximate 3 cm mobile, mass at 11:30-12:00 left breast and an approximate 3 cm smooth, round, firm, mobile mass at 5:30 left breast.  Taught patient self breast awareness.  Patient has been screened for eligibility.  She does not have any insurance, Medicare or Medicaid.  She also meets financial eligibility.  Hand-out given on the Affordable Care Act.    Plan:     Bilateral diagnostic mammogram and ultrasound ordered.  If no findings on imaging will refer for further evaluation with a surgical consult.  Patient is agreeable to the plan.  Will follow-up per [facility] protocol.     Discussed results of mammogram and ultrasound with patient showing multiple cysts  bilateral.  States she has occasional tenderness, but nothing significant.  Offered to schedule patient for further evaluation with surgical consult.  Although ultrasound shows bilateral breast to have multiple cysts, I feel it is still prudent to follow-up on the right breast mass.  Ultrasound showed a 2.4 cm mass at 11:30 right breast, but on palpation if felt to be irregular, lobulated and larger at 4 cm.  Patient is scheduled to see Dr. Evette CristalSankar on 01/07/16 @ 3:30.  She is to take her photo ID and all meds.  Will follow-up per protocol.

## 2015-12-25 ENCOUNTER — Ambulatory Visit: Payer: Self-pay | Admitting: General Surgery

## 2016-01-07 ENCOUNTER — Other Ambulatory Visit: Payer: Self-pay

## 2016-01-07 ENCOUNTER — Ambulatory Visit (INDEPENDENT_AMBULATORY_CARE_PROVIDER_SITE_OTHER): Payer: PRIVATE HEALTH INSURANCE | Admitting: General Surgery

## 2016-01-07 ENCOUNTER — Encounter: Payer: Self-pay | Admitting: General Surgery

## 2016-01-07 VITALS — BP 128/86 | HR 88 | Resp 12 | Ht 65.0 in | Wt 163.0 lb

## 2016-01-07 DIAGNOSIS — N6001 Solitary cyst of right breast: Secondary | ICD-10-CM | POA: Diagnosis not present

## 2016-01-07 DIAGNOSIS — N6002 Solitary cyst of left breast: Secondary | ICD-10-CM

## 2016-01-07 NOTE — Patient Instructions (Signed)
The patient is aware to call back for any questions or concerns.  

## 2016-01-07 NOTE — Progress Notes (Signed)
Patient ID: Ana Duncan, female   DOB: 1980/12/08, 35 y.o.   MRN: [id number]  Chief Complaint  Patient presents with  . Breast Problem    cyst    HPI Ana FitchDebbie M Duncan is a [age] y.o. female.  Here today for evaluation of breast cyst. She states she felt a knot in the right breast back in February. She does notice occasional tenderness Denies any nipple discharge or trauma to the breast. Her most recent mammogram and ultrasound was 12-23-15. She states the cyst on right breast and one on left inferior are the ones causing some pain and soreness She is here today with her husband, Ana Duncan, their daughter and mother, Ana BlaseDebbie Duncan. I have reviewed the history of present illness with the patient.  HPI  Past Medical History:  Diagnosis Date  . Anemia   . Chronic back pain   . Scoliosis   . Thyroid disease   . Vitamin B 12 deficiency     Past Surgical History:  Procedure Laterality Date  . BACK SURGERY     rod placement for scoliosis  . THYROIDECTOMY  08/26/2011   Procedure: THYROIDECTOMY;  Surgeon: Fabio BeringBrent C Ziegler, MD;  Location: [facility];  Service: General;  Laterality: N/A;  Total Thyroidectomy    Family History  Problem Relation Age of Onset  . Diabetes Mother   . Hypertension Mother   . Lupus Maternal Grandfather   . Anesthesia problems Neg Hx   . Hypotension Neg Hx   . Malignant hyperthermia Neg Hx   . Pseudochol deficiency Neg Hx   . Breast cancer Neg Hx     Social History Social History  Substance Use Topics  . Smoking status: Never Smoker  . Smokeless tobacco: Never Used  . Alcohol use No    Allergies  Allergen Reactions  . Morphine And Related Other (See Comments)    dizziness  . Food Nausea And Vomiting and Rash    CORN    Current Outpatient Prescriptions  Medication Sig Dispense Refill  . Cyanocobalamin (VITAMIN B-12 IJ) Inject 1 each into the muscle every 30 (thirty) days.     Marland Kitchen. HYDROcodone-acetaminophen (NORCO) 7.5-325 MG per tablet Take 1 tablet by  mouth every 4 (four) hours as needed. For pain 45 tablet 0  . levothyroxine (SYNTHROID, LEVOTHROID) 125 MCG tablet Take 125 mcg by mouth daily before breakfast.     No current facility-administered medications for this visit.     Review of Systems Review of Systems  Constitutional: Negative.   Respiratory: Negative.   Cardiovascular: Negative.     Blood pressure 128/86, pulse 88, resp. rate 12, height 5\' 5"  (1.651 m), weight 163 lb (73.9 kg), last menstrual period 12/23/2015.  Physical Exam Physical Exam  Constitutional: She is oriented to person, place, and time. She appears well-developed and well-nourished.  HENT:  Mouth/Throat: Oropharynx is clear and moist.  Eyes: Conjunctivae are normal. No scleral icterus.  Neck: Neck supple.  Cardiovascular: Normal rate, regular rhythm and normal heart sounds.   Pulmonary/Chest: Effort normal and breath sounds normal. Right breast exhibits mass. Right breast exhibits no inverted nipple, no nipple discharge, no skin change and no tenderness. Left breast exhibits mass. Left breast exhibits no inverted nipple, no nipple discharge, no skin change and no tenderness.  Multiple cystic nodules left breast, 3 cm nodule at 12 o'clock , 1.5 cm nodule at 1 o'clock and 5 o'clock. Right breast at 11 o'clock a 3.5 cm cystic nodule.  Abdominal: Soft.  Lymphadenopathy:  She has no cervical adenopathy.    She has no axillary adenopathy.  Neurological: She is alert and oriented to person, place, and time.  Skin: Skin is warm and dry.  Psychiatric: Her behavior is normal.    Data Reviewed Progress notes, mammogram and ultrasound. US showing multiple cysts Assessment    Multiple breast cysts, largest in right breast and 2 on left-12 and 5 ocl Pt was advised fully on benign nature of the cysts.  Given option of aspiration given the pain and tenderness. She was agreeable.    Plan   The large bilobed cyst in right breast was aspirated with US guidance  and 10ml turbid yellow-brown fluid removed. Also the cyst at 12 ocl and 5 ocl on left were asprated and yioelded total of 10 ml of similar fluid. All there cysts resolved fully post aspration    Follow up in 2 months.  This information has been scribed by Dorathy DaftMarsha Hatch RN, BSN,BC.   Jefrey Raburn G 01/09/2016, 8:32 AM

## 2016-01-09 ENCOUNTER — Encounter: Payer: Self-pay | Admitting: General Surgery

## 2016-01-21 ENCOUNTER — Encounter: Payer: Self-pay | Admitting: *Deleted

## 2016-01-21 NOTE — Progress Notes (Signed)
Patient with benign mammogram and benign cyst aspiration by Dr. Evette CristalSankar.  She is to follow-up in a couple of months with Dr. Evette CristalSankar.  [facility] to [place].

## 2016-03-11 ENCOUNTER — Ambulatory Visit: Payer: PRIVATE HEALTH INSURANCE | Admitting: General Surgery

## 2016-05-14 ENCOUNTER — Encounter: Payer: Self-pay | Admitting: *Deleted

## 2017-03-10 IMAGING — US US BREAST*L* LIMITED INC AXILLA
1 series · 13 of 22 positions shown · non-contrast
Comparison: None.

CLINICAL DATA: 35-year-old female with right breast lump for
several months at [DATE]. Additional breast lumps were felt in the
left breast on clinical exam at [DATE] to 12 o'clock, 1 o'clock, and
530. This is the patient's baseline exam.

EXAM:
2D DIGITAL DIAGNOSTIC BILATERAL MAMMOGRAM WITH CAD AND ADJUNCT TOMO
ULTRASOUND BILATERAL BREAST

[Series 1: us breast*left* limited inc axilla · 0.07mm/px · 13 of 22 slices shown]
[im 1/22]
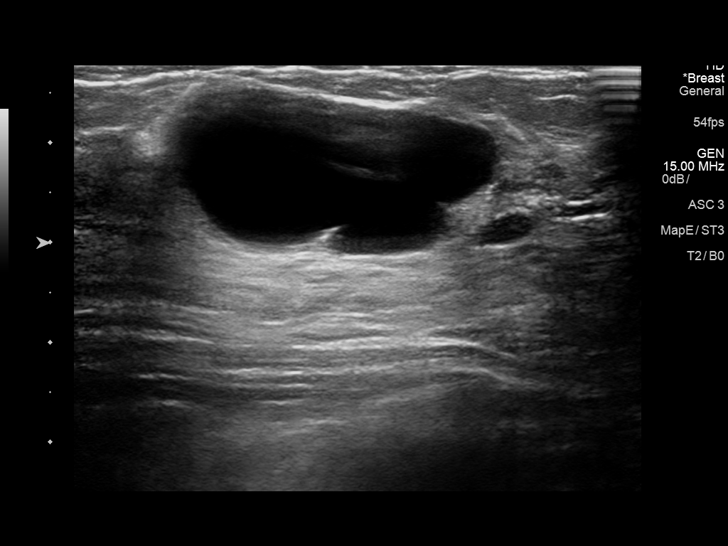
[im 3/22]
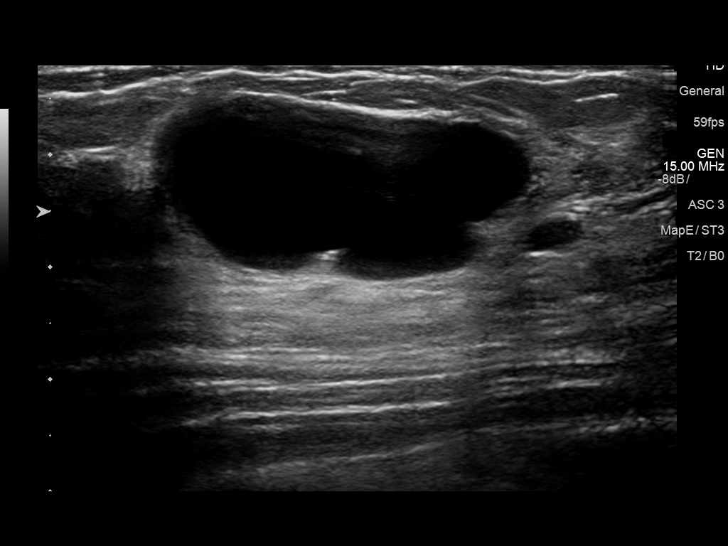
[im 5/22]
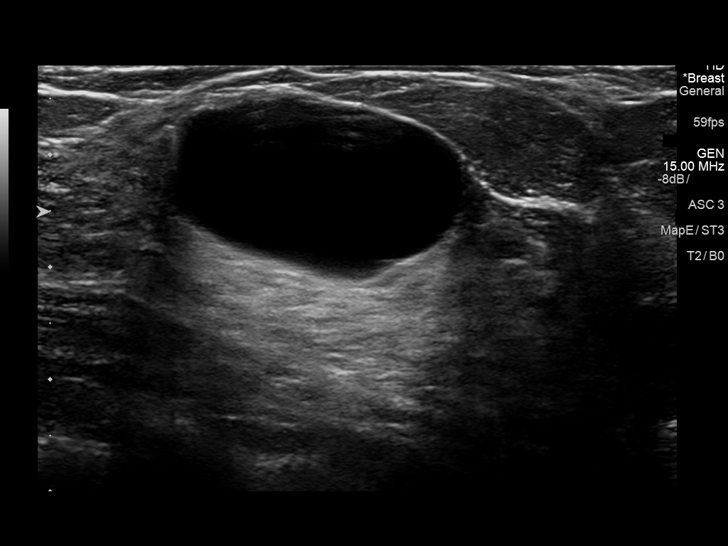
[im 6/22]
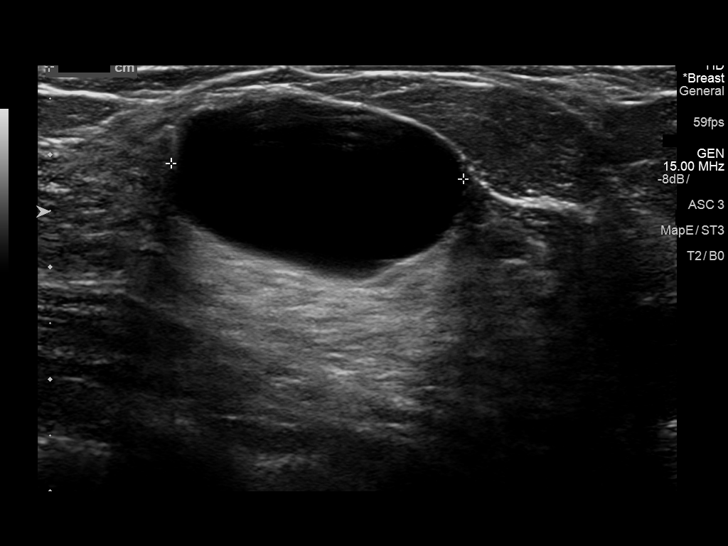
[im 8/22]
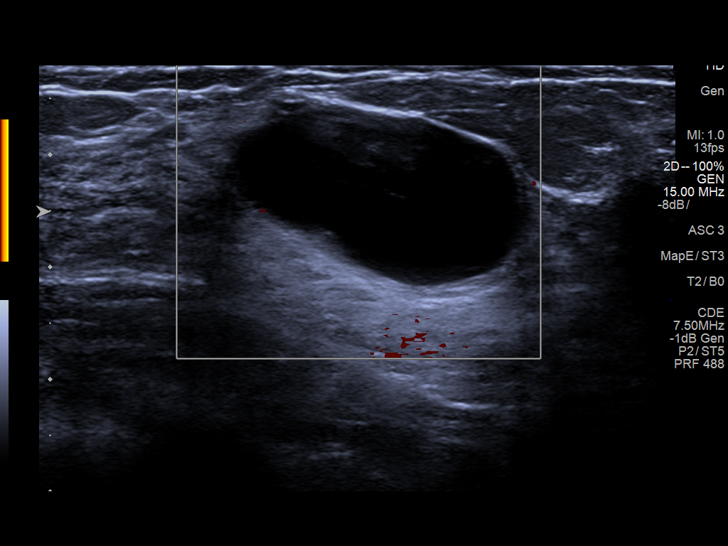
[im 10/22]
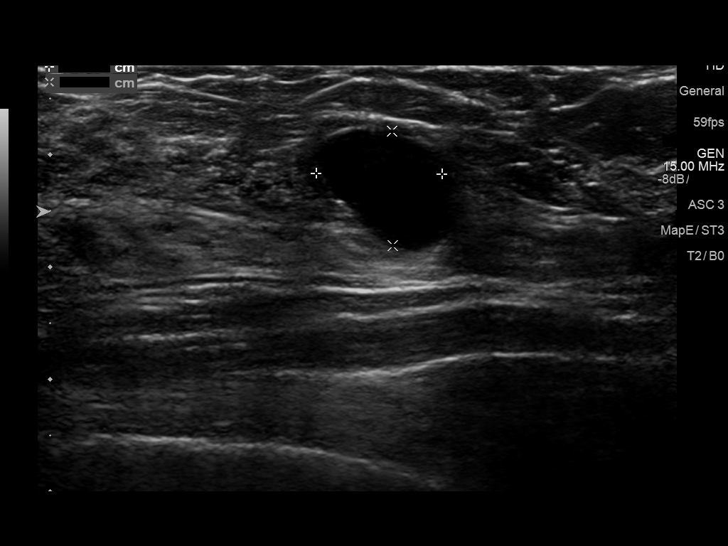
[im 12/22]
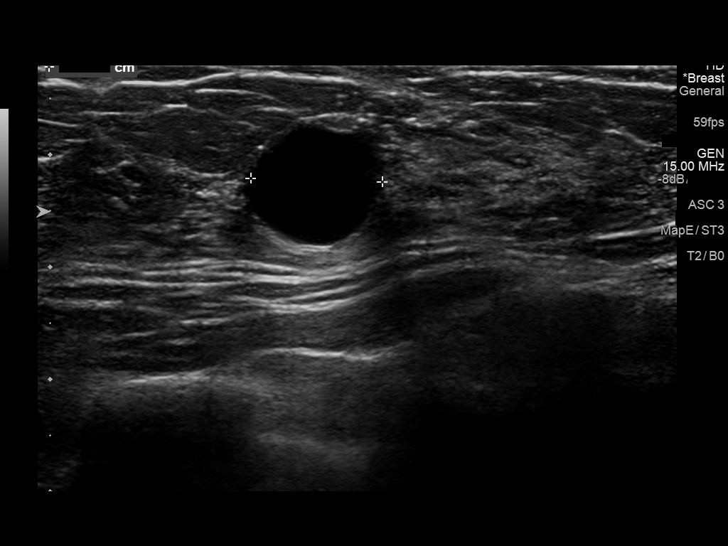
[im 13/22]
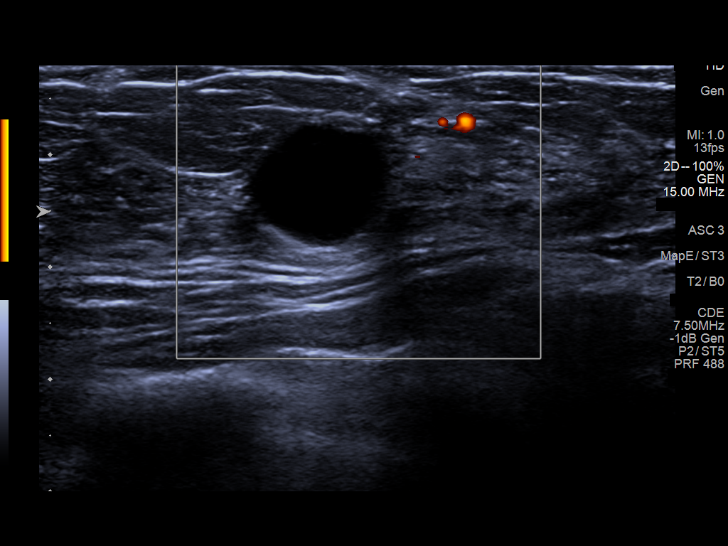
[im 15/22]
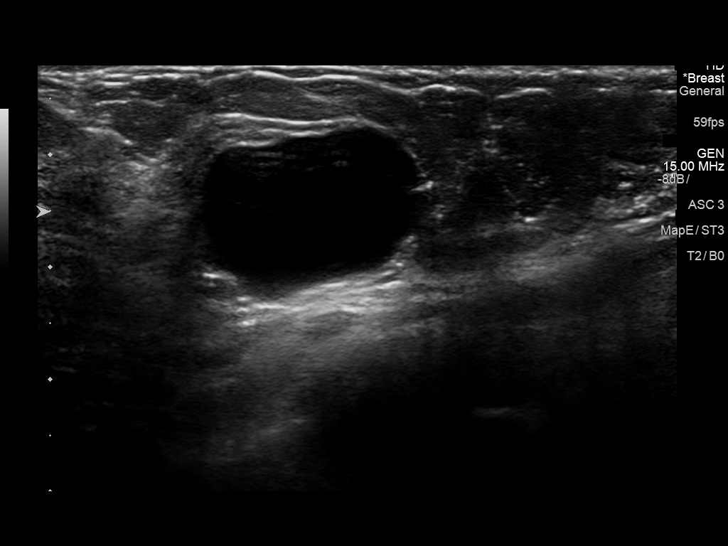
[im 17/22]
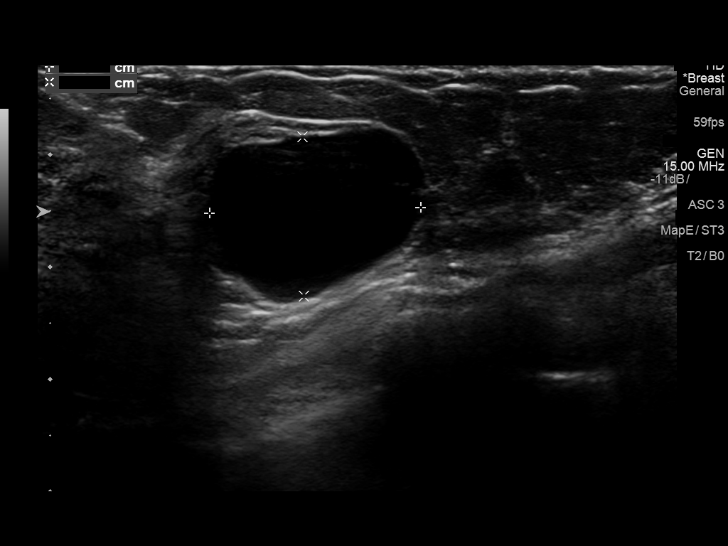
[im 18/22]
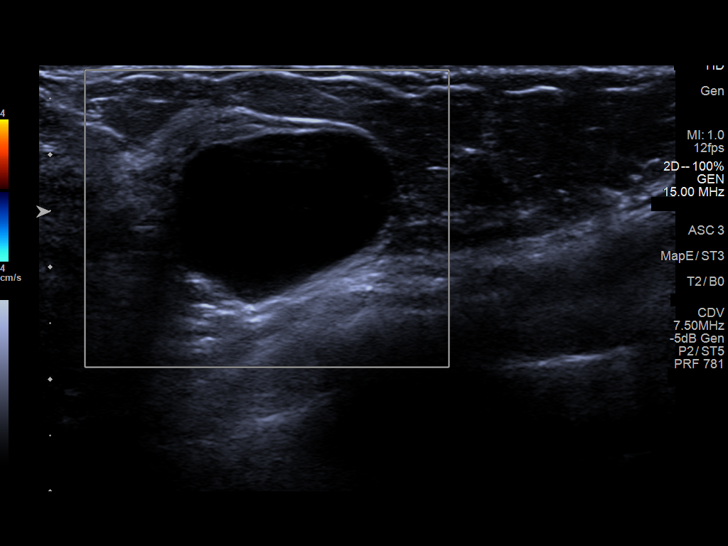
[im 20/22]
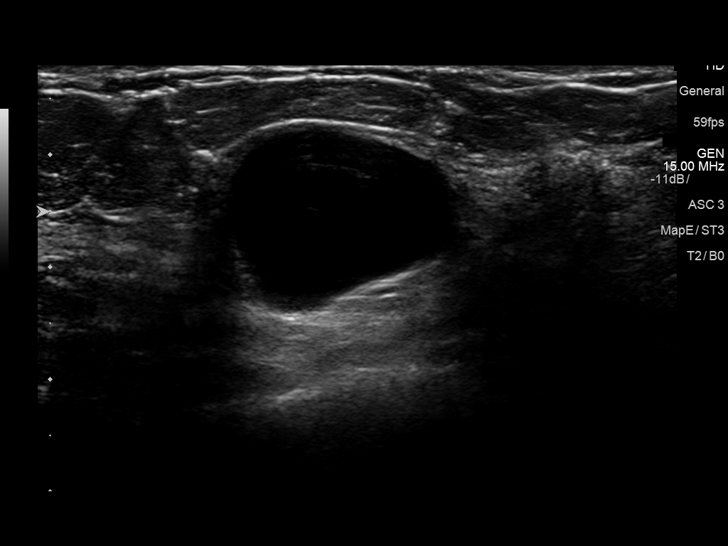
[im 22/22]
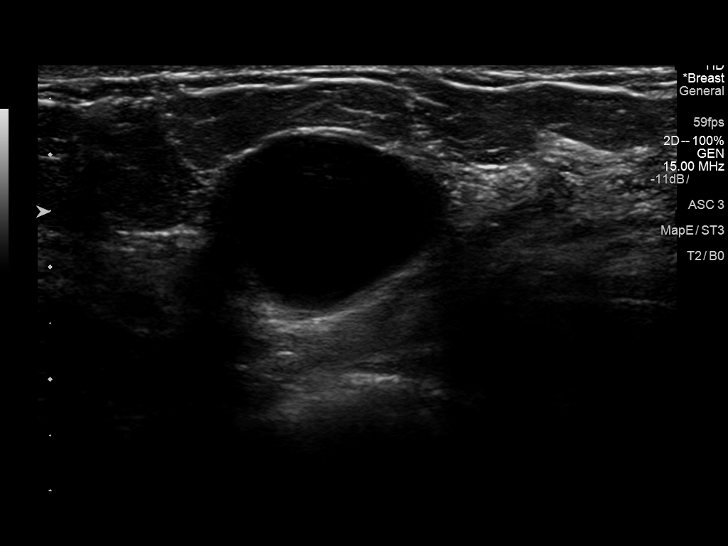

[13 of 22 positions shown; findings below may reference images not displayed]

ACR Breast Density Category c: The breast tissue is heterogeneously
dense, which may obscure small masses.
FINDINGS: There are multiple bilateral oval masses with partially
circumscribed and partially obscured margins including an oval mass
underlying the palpable marker in the upper, outer right breast and
an oval mass underlying the palpable marker at 12 o'clock in the
left breast. In addition, there is an oval, circumscribed mass
within the lower, outer left breast, posterior depth, likely
corresponding to the palpable abnormality at [DATE]. A definite mass
is not seen mammographically underlying the palpable marker at 1
o'clock in the left breast.

Mammographic images were processed with CAD.

On physical exam, mobile, palpable oval masses are felt within the
right breast at [DATE], 4 cm from the nipple and within the left
breast at 12 o'clock, 3 cm from the nipple, 1 o'clock, 4-5 cm from
the nipple, and 530, 4 cm from the nipple.

Targeted ultrasound of the area of concern within the right breast
was performed demonstrating a cyst at [DATE], 4 cm from the nipple
measuring 2.5 x 2.2 x 2.4 cm, corresponding to the palpable
abnormality. Numerous additional cysts are seen in the adjacent
breast.

Targeted ultrasound of the left breast was performed demonstrating
numerous cysts including a 3.4 x 1.6 x 2.6 cm cyst at 12 o'clock, 3
cm from the nipple, a 1.1 x 1 x 1.2 cm cyst at 1 o'clock, 4-5 cm
from the nipple, and a 1.9 x 1.4 x 2 cm cyst at [DATE], 4 cm from the
nipple. The cysts at 12 o'clock, 3 cm from the nipple and 530, 4 cm
from the nipple, correspond to the masses seen mammographically.
Numerous additional cysts were seen in the surrounding adjacent
breast tissue at each site.
IMPRESSION: Bilateral breast cysts. No mammographic or sonographic evidence of
malignancy.

RECOMMENDATION:
Screening mammogram at age 40 unless there are persistent or
intervening clinical concerns. (Code:OK-M-CZW)

I have discussed the findings and recommendations with the patient.
Results were also provided in writing at the conclusion of the
visit. If applicable, a reminder letter will be sent to the patient
regarding the next appointment.

BI-RADS CATEGORY  2: Benign.

## 2017-03-10 IMAGING — US US BREAST*R* LIMITED INC AXILLA
1 series · 4 of 4 positions shown · non-contrast
Comparison: None.

CLINICAL DATA: 35-year-old female with right breast lump for
several months at [DATE]. Additional breast lumps were felt in the
left breast on clinical exam at [DATE] to 12 o'clock, 1 o'clock, and
530. This is the patient's baseline exam.

EXAM:
2D DIGITAL DIAGNOSTIC BILATERAL MAMMOGRAM WITH CAD AND ADJUNCT TOMO
ULTRASOUND BILATERAL BREAST

[Series 1: us breast*right* limited inc axilla · 0.07mm/px · 4 of 4 slices shown]
[im 1/4]
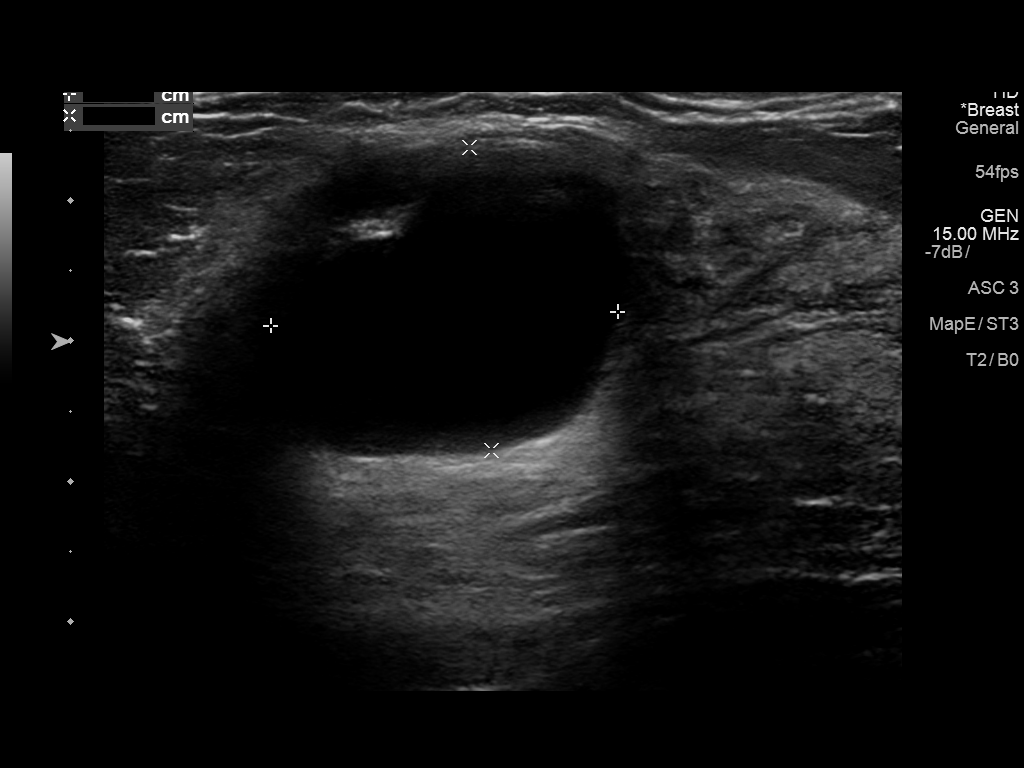
[im 2/4]
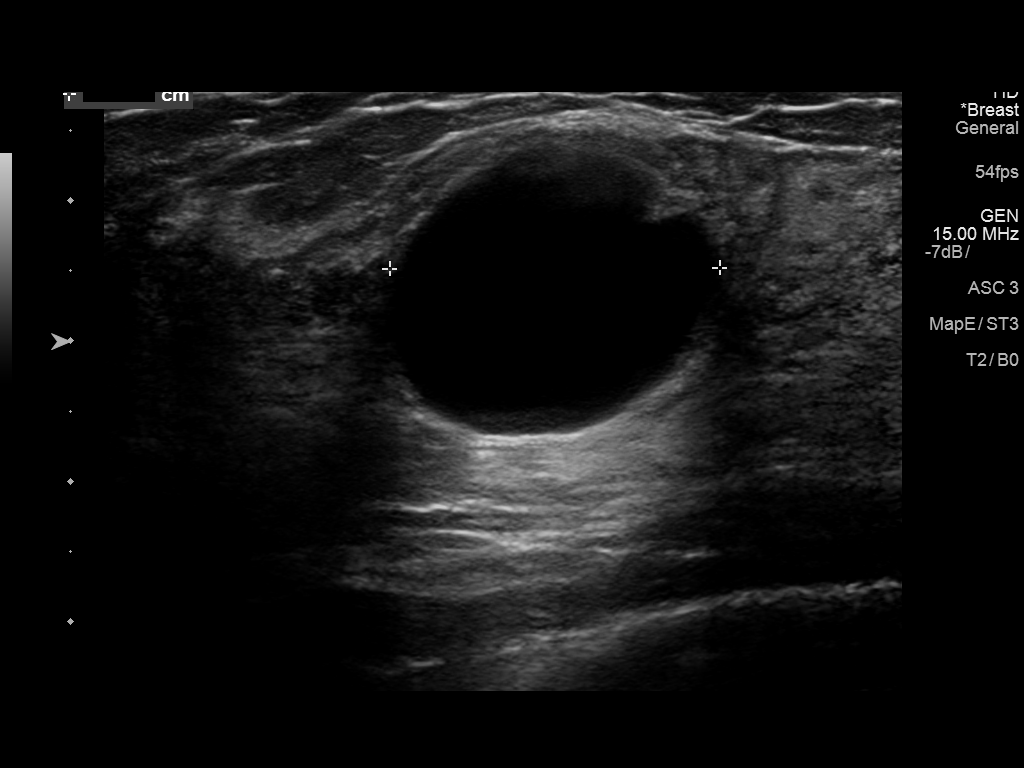
[im 3/4]
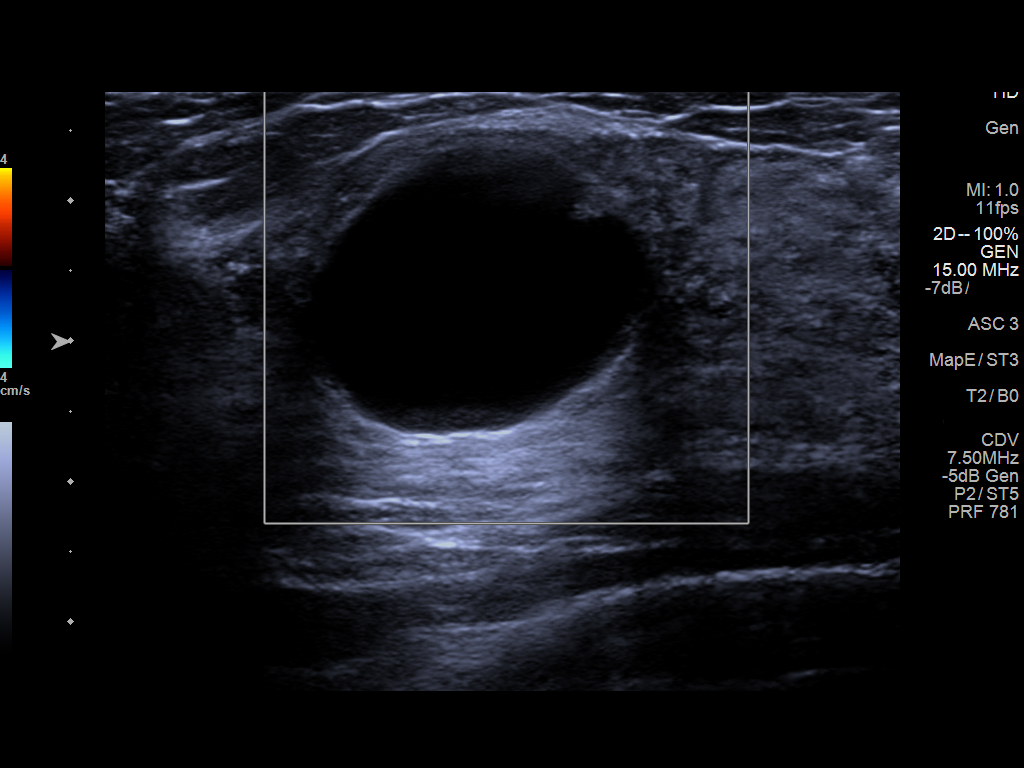
[im 4/4]
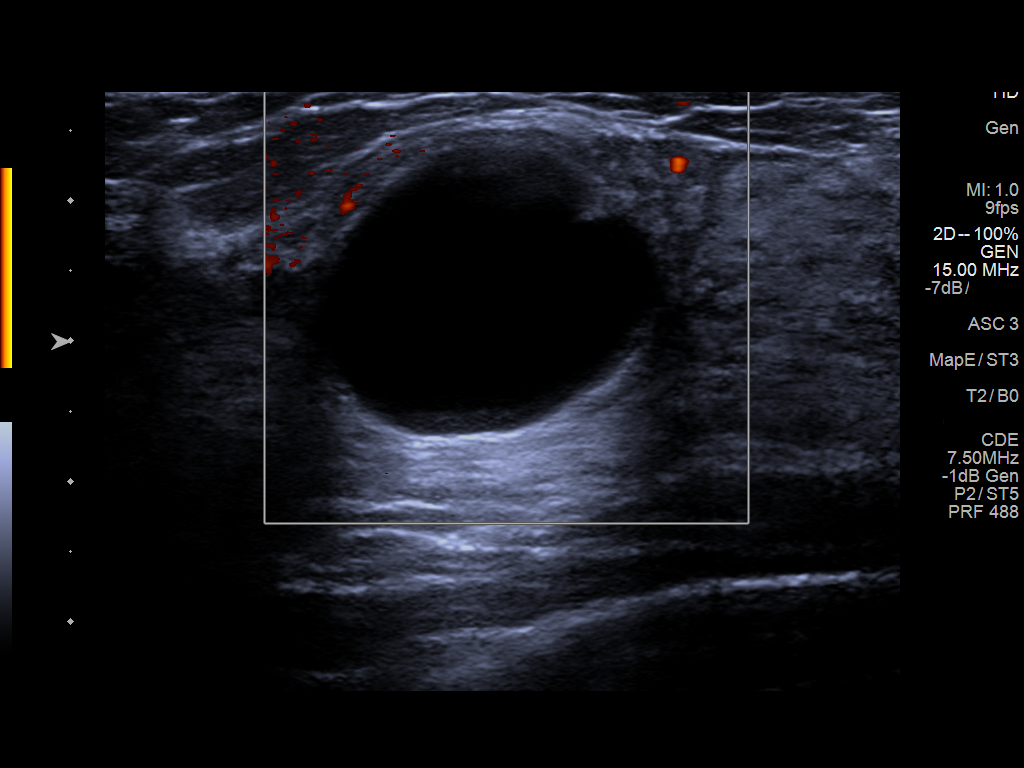

[4 of 4 positions shown; findings below may reference images not displayed]

ACR Breast Density Category c: The breast tissue is heterogeneously
dense, which may obscure small masses.
FINDINGS: There are multiple bilateral oval masses with partially
circumscribed and partially obscured margins including an oval mass
underlying the palpable marker in the upper, outer right breast and
an oval mass underlying the palpable marker at 12 o'clock in the
left breast. In addition, there is an oval, circumscribed mass
within the lower, outer left breast, posterior depth, likely
corresponding to the palpable abnormality at [DATE]. A definite mass
is not seen mammographically underlying the palpable marker at 1
o'clock in the left breast.

Mammographic images were processed with CAD.

On physical exam, mobile, palpable oval masses are felt within the
right breast at [DATE], 4 cm from the nipple and within the left
breast at 12 o'clock, 3 cm from the nipple, 1 o'clock, 4-5 cm from
the nipple, and 530, 4 cm from the nipple.

Targeted ultrasound of the area of concern within the right breast
was performed demonstrating a cyst at [DATE], 4 cm from the nipple
measuring 2.5 x 2.2 x 2.4 cm, corresponding to the palpable
abnormality. Numerous additional cysts are seen in the adjacent
breast.

Targeted ultrasound of the left breast was performed demonstrating
numerous cysts including a 3.4 x 1.6 x 2.6 cm cyst at 12 o'clock, 3
cm from the nipple, a 1.1 x 1 x 1.2 cm cyst at 1 o'clock, 4-5 cm
from the nipple, and a 1.9 x 1.4 x 2 cm cyst at [DATE], 4 cm from the
nipple. The cysts at 12 o'clock, 3 cm from the nipple and 530, 4 cm
from the nipple, correspond to the masses seen mammographically.
Numerous additional cysts were seen in the surrounding adjacent
breast tissue at each site.
IMPRESSION: Bilateral breast cysts. No mammographic or sonographic evidence of
malignancy.

RECOMMENDATION:
Screening mammogram at age 40 unless there are persistent or
intervening clinical concerns. (Code:OK-[id number])

I have discussed the findings and recommendations with the patient.
Results were also provided in writing at the conclusion of the
visit. If applicable, a reminder letter will be sent to the patient
regarding the next appointment.

BI-RADS CATEGORY  2: Benign.

## 2017-11-05 ENCOUNTER — Emergency Department (HOSPITAL_COMMUNITY)
Admission: EM | Admit: 2017-11-05 | Discharge: 2017-11-05 | Disposition: A | Discharge status: Home / Self Care | Payer: Self-pay

## 2017-11-09 ENCOUNTER — Encounter: Payer: Self-pay | Admitting: Orthopaedic Surgery

## 2017-11-09 ENCOUNTER — Ambulatory Visit (INDEPENDENT_AMBULATORY_CARE_PROVIDER_SITE_OTHER): Payer: PRIVATE HEALTH INSURANCE | Admitting: Orthopaedic Surgery

## 2017-11-09 ENCOUNTER — Ambulatory Visit (INDEPENDENT_AMBULATORY_CARE_PROVIDER_SITE_OTHER): Payer: PRIVATE HEALTH INSURANCE

## 2017-11-09 VITALS — BP 106/67 | HR 69 | Temp 98.4°F | Ht 65.0 in | Wt 162.0 lb

## 2017-11-09 DIAGNOSIS — M5441 Lumbago with sciatica, right side: Secondary | ICD-10-CM

## 2017-11-09 DIAGNOSIS — G8929 Other chronic pain: Secondary | ICD-10-CM | POA: Diagnosis not present

## 2017-11-09 MED ORDER — CYCLOBENZAPRINE HCL 10 MG PO TABS: 10.0000 mg | ORAL_TABLET | Freq: Every day | ORAL | 0 refills | Status: DC

## 2017-11-09 MED ORDER — HYDROCODONE-ACETAMINOPHEN 5-325 MG PO TABS: 1.0000 | ORAL_TABLET | ORAL | 0 refills | Status: AC | PRN

## 2017-11-09 MED ORDER — PREDNISONE 5 MG (21) PO TBPK: ORAL_TABLET | ORAL | 0 refills | Status: DC

## 2017-11-09 NOTE — Progress Notes (Signed)
Subjective:    Patient ID: Ana Duncan, female    DOB: 08-07-1980, 37 y.o.   MRN: [id number]  HPI She has recurrence of chronic lower back pain that has gotten worse over the last six weeks.  She has pain running down the posterior right thigh to the right foot.  She has had spasm of the lower back on the right. She has no trauma, no weakness.  I saw her in 2015 for this but have not seen her since then.  She has taken Advil, used ice, heat and rubs with little help. She has had surgery on the back in the past with rods for scoliosis.  I reviewed her old records.  Review of Systems  Respiratory: Negative for cough and shortness of breath.   Cardiovascular: Negative for chest pain and leg swelling.  Musculoskeletal: Positive for arthralgias, back pain and myalgias.  All other systems reviewed and are negative.  Past Medical History:  Diagnosis Date  . Anemia   . Chronic back pain   . Scoliosis   . Thyroid disease   . Vitamin B 12 deficiency     Past Surgical History:  Procedure Laterality Date  . BACK SURGERY     rod placement for scoliosis  . THYROIDECTOMY  08/26/2011   Procedure: THYROIDECTOMY;  Surgeon: Fabio BeringBrent C Ziegler, MD;  Location: [facility];  Service: General;  Laterality: N/A;  Total Thyroidectomy    Current Outpatient Medications on File Prior to Visit  Medication Sig Dispense Refill  . Cyanocobalamin (VITAMIN B-12 IJ) Inject 1 each into the muscle every 30 (thirty) days.     Marland Kitchen. levothyroxine (SYNTHROID, LEVOTHROID) 125 MCG tablet Take 125 mcg by mouth daily before breakfast.    . LINZESS 145 MCG CAPS capsule Take 145 mcg by mouth daily.  3  . HYDROcodone-acetaminophen (NORCO) 7.5-325 MG per tablet Take 1 tablet by mouth every 4 (four) hours as needed. For pain (Patient not taking: Reported on 11/09/2017) 45 tablet 0   No current facility-administered medications on file prior to visit.     Social History   Socioeconomic History  . Marital status: Single    Spouse  name: Not on file  . Number of children: Not on file  . Years of education: Not on file  . Highest education level: Not on file  Occupational History  . Not on file  Social Needs  . Financial resource strain: Not on file  . Food insecurity:    Worry: Not on file    Inability: Not on file  . Transportation needs:    Medical: Not on file    Non-medical: Not on file  Tobacco Use  . Smoking status: Never Smoker  . Smokeless tobacco: Never Used  Substance and Sexual Activity  . Alcohol use: No  . Drug use: Yes    Types: Marijuana  . Sexual activity: Yes    Birth control/protection: None, Implant  Lifestyle  . Physical activity:    Days per week: Not on file    Minutes per session: Not on file  . Stress: Not on file  Relationships  . Social connections:    Talks on phone: Not on file    Gets together: Not on file    Attends religious service: Not on file    Active member of club or organization: Not on file    Attends meetings of clubs or organizations: Not on file    Relationship status: Not on file  . Intimate  partner violence:    Fear of current or ex partner: Not on file    Emotionally abused: Not on file    Physically abused: Not on file    Forced sexual activity: Not on file  Other Topics Concern  . Not on file  Social History Narrative  . Not on file    Family History  Problem Relation Age of Onset  . Diabetes Mother   . Hypertension Mother   . Lupus Maternal Grandfather   . Anesthesia problems Neg Hx   . Hypotension Neg Hx   . Malignant hyperthermia Neg Hx   . Pseudochol deficiency Neg Hx   . Breast cancer Neg Hx     BP 106/67   Pulse 69   Temp 98.4 F (36.9 C)   Ht 5\' 5"  (1.651 m)   Wt 162 lb (73.5 kg)   LMP 10/23/2017   BMI 26.96 kg/m      Objective:   Physical Exam  Constitutional: She is oriented to person, place, and time. She appears well-developed and well-nourished.  HENT:  Head: Normocephalic and atraumatic.  Eyes: Pupils are  equal, round, and reactive to light. Conjunctivae and EOM are normal.  Neck: Normal range of motion. Neck supple.  Cardiovascular: Normal rate, regular rhythm and intact distal pulses.  Pulmonary/Chest: Effort normal.  Abdominal: Soft.  Musculoskeletal:       Back:  Neurological: She is alert and oriented to person, place, and time. She has normal reflexes. She displays normal reflexes. No cranial nerve deficit. She exhibits normal muscle tone. Coordination normal.  Skin: Skin is warm and dry.  Psychiatric: She has a normal mood and affect. Her behavior is normal. Judgment and thought content normal.    X-rays of the lumbar spine were done, reported separately.      Assessment & Plan:   Encounter Diagnosis  Name Primary?  . Chronic right-sided low back pain with right-sided sciatica Yes   I will begin prednisone dose pack.  I will begin Flexeril 10 nightly.  I will begin pain medicine.  I have reviewed the [place] Controlled Substance Reporting System web site prior to prescribing narcotic medicine for this patient.  I will see her back in one week.  She may need CT scan with myleogram.  Call if any problem.  Precautions discussed.   Electronically Signed Darreld Mclean, MD 6/25/20192:22 PM

## 2017-11-16 ENCOUNTER — Encounter: Payer: Self-pay | Admitting: Orthopaedic Surgery

## 2017-11-16 ENCOUNTER — Ambulatory Visit (INDEPENDENT_AMBULATORY_CARE_PROVIDER_SITE_OTHER): Payer: PRIVATE HEALTH INSURANCE | Admitting: Orthopaedic Surgery

## 2017-11-16 VITALS — BP 92/53 | HR 83 | Temp 97.6°F | Ht 65.0 in | Wt 164.0 lb

## 2017-11-16 DIAGNOSIS — G8929 Other chronic pain: Secondary | ICD-10-CM | POA: Diagnosis not present

## 2017-11-16 DIAGNOSIS — M5441 Lumbago with sciatica, right side: Secondary | ICD-10-CM

## 2017-11-16 MED ORDER — DICLOFENAC SODIUM 75 MG PO TBEC
75.0000 mg | DELAYED_RELEASE_TABLET | Freq: Two times a day (BID) | ORAL | 2 refills | Status: DC
Start: 2017-11-16 — End: 2020-06-18

## 2017-11-16 NOTE — Progress Notes (Signed)
Patient ZO:XWRUEA:Ana Duncan, female DOB:Feb 13, 1981, 37 y.o. VWU:[id number]  Chief Complaint  Patient presents with  . Back Pain    Follow up on back pain.    HPI  Ana Duncan is a 37 y.o. female who has lower back pain.  She is a little better.  The prednisone dose pack helped.  She has less pain. She still has right sided sciatica but much less. She is doing her exercises.  She has no weakness.   Body mass index is 27.29 kg/m.  ROS  Review of Systems  Respiratory: Negative for cough and shortness of breath.   Cardiovascular: Negative for chest pain and leg swelling.  Musculoskeletal: Positive for arthralgias, back pain and myalgias.  All other systems reviewed and are negative.   All other systems reviewed and are negative.  Past Medical History:  Diagnosis Date  . Anemia   . Chronic back pain   . Scoliosis   . Thyroid disease   . Vitamin B 12 deficiency     Past Surgical History:  Procedure Laterality Date  . BACK SURGERY     rod placement for scoliosis  . THYROIDECTOMY  08/26/2011   Procedure: THYROIDECTOMY;  Surgeon: Fabio BeringBrent C Ziegler, MD;  Location: [facility];  Service: General;  Laterality: N/A;  Total Thyroidectomy    Family History  Problem Relation Age of Onset  . Diabetes Mother   . Hypertension Mother   . Lupus Maternal Grandfather   . Anesthesia problems Neg Hx   . Hypotension Neg Hx   . Malignant hyperthermia Neg Hx   . Pseudochol deficiency Neg Hx   . Breast cancer Neg Hx     Social History Social History   Tobacco Use  . Smoking status: Never Smoker  . Smokeless tobacco: Never Used  Substance Use Topics  . Alcohol use: No  . Drug use: Yes    Types: Marijuana    Allergies  Allergen Reactions  . Morphine And Related Other (See Comments)    dizziness  . Food Nausea And Vomiting and Rash    CORN    Current Outpatient Medications  Medication Sig Dispense Refill  . Cyanocobalamin (VITAMIN B-12 IJ) Inject 1 each into the muscle every 30  (thirty) days.     . cyclobenzaprine (FLEXERIL) 10 MG tablet Take 1 tablet (10 mg total) by mouth at bedtime. One tablet every night at bedtime as needed for spasm. 30 tablet 0  . diclofenac (VOLTAREN) 75 MG EC tablet Take 1 tablet (75 mg total) by mouth 2 (two) times daily with a meal. 60 tablet 2  . levothyroxine (SYNTHROID, LEVOTHROID) 125 MCG tablet Take 125 mcg by mouth daily before breakfast.    . LINZESS 145 MCG CAPS capsule Take 145 mcg by mouth daily.  3  . predniSONE (STERAPRED UNI-PAK 21 TAB) 5 MG (21) TBPK tablet Take 6 pills first day; 5 pills second day; 4 pills third day; 3 pills fourth day; 2 pills next day and 1 pill last day. 21 tablet 0   No current facility-administered medications for this visit.      Physical Exam  Blood pressure (!) 92/53, pulse 83, temperature 97.6 F (36.4 C), height 5\' 5"  (1.651 m), weight 164 lb (74.4 kg), last menstrual period 10/23/2017.  Constitutional: overall normal hygiene, normal nutrition, well developed, normal grooming, normal body habitus. Assistive device:none  Musculoskeletal: gait and station Limp none, muscle tone and strength are normal, no tremors or atrophy is present.  .  Neurological:  coordination overall normal.  Deep tendon reflex/nerve stretch intact.  Sensation normal.  Cranial nerves II-XII intact.   Skin:   Normal overall no scars, lesions, ulcers or rashes. No psoriasis.  Psychiatric: Alert and oriented x 3.  Recent memory intact, remote memory unclear.  Normal mood and affect. Well groomed.  Good eye contact.  Cardiovascular: overall no swelling, no varicosities, no edema bilaterally, normal temperatures of the legs and arms, no clubbing, cyanosis and good capillary refill.  Lymphatic: palpation is normal.  Spine/Pelvis examination:  Inspection:  Overall, sacoiliac joint benign and hips nontender; without crepitus or defects.   Thoracic spine inspection: Alignment normal without kyphosis present   Lumbar spine  inspection:  Alignment  with normal lumbar lordosis, without scoliosis apparent.   Thoracic spine palpation:  without tenderness of spinal processes   Lumbar spine palpation: without tenderness of lumbar area; without tightness of lumbar muscles    Range of Motion:   Lumbar flexion, forward flexion is normal without pain or tenderness    Lumbar extension is full without pain or tenderness   Left lateral bend is normal without pain or tenderness   Right lateral bend is normal without pain or tenderness   Straight leg raising is normal  Strength & tone: normal   Stability overall normal stability All other systems reviewed and are negative   The patient has been educated about the nature of the problem(s) and counseled on treatment options.  The patient appeared to understand what I have discussed and is in agreement with it.  Encounter Diagnosis  Name Primary?  . Chronic right-sided low back pain with right-sided sciatica Yes    PLAN Call if any problems.  Precautions discussed.  Continue current medications.   Return to clinic 3 weeks   I have also added diclofenac 75 mgm bid.  I want her to continue her other medicine.  Electronically Signed Darreld Mclean, MD 7/2/20192:56 PM

## 2017-12-08 ENCOUNTER — Ambulatory Visit: Payer: PRIVATE HEALTH INSURANCE | Admitting: Orthopaedic Surgery

## 2017-12-08 ENCOUNTER — Encounter: Payer: Self-pay | Admitting: Orthopaedic Surgery

## 2017-12-10 ENCOUNTER — Other Ambulatory Visit: Payer: Self-pay | Admitting: Orthopaedic Surgery

## 2017-12-17 ENCOUNTER — Telehealth: Payer: Self-pay | Admitting: Orthopaedic Surgery

## 2017-12-17 NOTE — Telephone Encounter (Signed)
Patient requests refill on Hydrocodone/Acetaminophen 5-325  Mgs.  Qty 30  Sig: Take 1 tablet by mouth every 4 (four) hours as needed for up to 5 days for moderate pain.  Patient [place] [facility] in [place]

## 2017-12-20 MED ORDER — HYDROCODONE-ACETAMINOPHEN 5-325 MG PO TABS: ORAL_TABLET | ORAL | 0 refills | Status: DC

## 2018-01-04 ENCOUNTER — Ambulatory Visit: Payer: PRIVATE HEALTH INSURANCE | Admitting: Orthopaedic Surgery

## 2019-02-15 DIAGNOSIS — K5904 Chronic idiopathic constipation: Secondary | ICD-10-CM | POA: Insufficient documentation

## 2019-03-31 DIAGNOSIS — E538 Deficiency of other specified B group vitamins: Secondary | ICD-10-CM | POA: Insufficient documentation

## 2019-03-31 DIAGNOSIS — E039 Hypothyroidism, unspecified: Secondary | ICD-10-CM | POA: Insufficient documentation

## 2019-12-24 DIAGNOSIS — E785 Hyperlipidemia, unspecified: Secondary | ICD-10-CM | POA: Insufficient documentation

## 2020-01-25 ENCOUNTER — Encounter (HOSPITAL_COMMUNITY): Payer: Self-pay

## 2020-01-25 ENCOUNTER — Other Ambulatory Visit: Payer: Self-pay

## 2020-01-25 DIAGNOSIS — E039 Hypothyroidism, unspecified: Secondary | ICD-10-CM | POA: Diagnosis not present

## 2020-01-25 DIAGNOSIS — U071 COVID-19: Secondary | ICD-10-CM | POA: Insufficient documentation

## 2020-01-25 DIAGNOSIS — R05 Cough: Secondary | ICD-10-CM | POA: Diagnosis present

## 2020-01-25 DIAGNOSIS — Z79890 Hormone replacement therapy (postmenopausal): Secondary | ICD-10-CM | POA: Diagnosis not present

## 2020-01-25 DIAGNOSIS — Z79899 Other long term (current) drug therapy: Secondary | ICD-10-CM | POA: Diagnosis not present

## 2020-01-25 LAB — SARS CORONAVIRUS 2 BY RT PCR (HOSPITAL ORDER, PERFORMED IN ~~LOC~~ HOSPITAL LAB): SARS Coronavirus 2: POSITIVE — AB

## 2020-01-25 NOTE — ED Notes (Signed)
Date and time results received: 01/25/20 2350  Test: COVID Critical Value: Positive   Name of Provider Notified: Delo   Orders Received? Or Actions Taken?: na

## 2020-01-25 NOTE — ED Triage Notes (Signed)
Pt comes in complaining of possible covid s/s for 3 days. Chills, loss of appetite, congestion, loss of taste

## 2020-01-26 ENCOUNTER — Emergency Department (HOSPITAL_COMMUNITY)
Admission: EM | Admit: 2020-01-26 | Discharge: 2020-01-26 | Disposition: A | Discharge status: Home / Self Care | Payer: PRIVATE HEALTH INSURANCE | Attending: Emergency Medicine | Admitting: Emergency Medicine

## 2020-01-26 ENCOUNTER — Telehealth: Payer: Self-pay | Admitting: Family

## 2020-01-26 DIAGNOSIS — M545 Low back pain, unspecified: Secondary | ICD-10-CM

## 2020-01-26 DIAGNOSIS — U071 COVID-19: Secondary | ICD-10-CM

## 2020-01-26 MED ORDER — ONDANSETRON 8 MG PO TBDP
8.0000 mg | ORAL_TABLET | Freq: Once | ORAL | Status: AC
  Administered 2020-01-26: 8 mg via ORAL
  Filled 2020-01-26: qty 1

## 2020-01-26 MED ORDER — HYDROMORPHONE HCL 1 MG/ML IJ SOLN
1.0000 mg | Freq: Once | INTRAMUSCULAR | Status: AC
  Administered 2020-01-26: 1 mg via INTRAMUSCULAR
  Filled 2020-01-26: qty 1

## 2020-01-26 NOTE — Telephone Encounter (Signed)
Called to Discuss with patient about Covid symptoms and the use of the monoclonal antibody infusion for those with mild to moderate Covid symptoms and at a high risk of hospitalization.     Pt appears to qualify for this infusion due to co-morbid conditions and/or a member of an at-risk group in accordance with the FDA Emergency Use Authorization.   Ana Duncan was seen in the ED at [facility] earlier today with chills, cough and fevers. COVID testing positive. Risk factors include BMI >25. Discussed the risks and benefits of treatment with Regeneron and she would like to think about it. Information will be sent to email for her review with hotline to call back if she wishes to proceed. Will need to be infused by 9/18.   Marcos Eke, NP 01/26/2020 12:51 PM

## 2020-01-26 NOTE — ED Provider Notes (Signed)
[facility] EMERGENCY DEPARTMENT Provider Note   CSN: [id number] Arrival date & time: 01/25/20  2228     History Chief Complaint  Patient presents with  . Chills    Ana Duncan is a 39 y.o. female.  Patient is a 39 year old female with history of chronic back pain and hypothyroidism.  She presents today for evaluation of cough, fever, and feeling generally unwell.  This is been ongoing for the past several days.  Patient denies any known exposures to COVID-19.  She denies any aggravating or alleviating factors.  She does have a history of prior back surgery and her back pain has increased significantly along with this illness.  She is requesting something for pain.  She denies any radiation into her legs, weakness, or bowel or bladder complaints.  The history is provided by the patient.       Past Medical History:  Diagnosis Date  . Anemia   . Chronic back pain   . Scoliosis   . Thyroid disease   . Vitamin B 12 deficiency     There are no problems to display for this patient.   Past Surgical History:  Procedure Laterality Date  . BACK SURGERY     rod placement for scoliosis  . THYROIDECTOMY  08/26/2011   Procedure: THYROIDECTOMY;  Surgeon: Fabio Bering, MD;  Location: [facility];  Service: General;  Laterality: N/A;  Total Thyroidectomy     OB History    Gravida  1   Para  1   Term      Preterm      AB      Living        SAB      TAB      Ectopic      Multiple      Live Births           Obstetric Comments  1st Menstrual Cycle:  15  1st Pregnancy:  35         Family History  Problem Relation Age of Onset  . Diabetes Mother   . Hypertension Mother   . Lupus Maternal Grandfather   . Anesthesia problems Neg Hx   . Hypotension Neg Hx   . Malignant hyperthermia Neg Hx   . Pseudochol deficiency Neg Hx   . Breast cancer Neg Hx     Social History   Tobacco Use  . Smoking status: Never Smoker  . Smokeless tobacco: Never Used    Substance Use Topics  . Alcohol use: No  . Drug use: Yes    Types: Marijuana    Home Medications Prior to Admission medications   Medication Sig Start Date End Date Taking? Authorizing Provider  Cyanocobalamin (VITAMIN B-12 IJ) Inject 1 each into the muscle every 30 (thirty) days.     [provider]  cyclobenzaprine (FLEXERIL) 10 MG tablet TAKE 1 TABLET BY MOUTH AT BEDTIME AS NEEDED FOR MUSCLE SPASM 12/13/17   Darreld Mclean, MD  diclofenac (VOLTAREN) 75 MG EC tablet Take 1 tablet (75 mg total) by mouth 2 (two) times daily with a meal. 11/16/17   Darreld Mclean, MD  HYDROcodone-acetaminophen (NORCO/VICODIN) 5-325 MG tablet One tablet every six hours for pain.  Limit 7 days. 12/20/17   Darreld Mclean, MD  levothyroxine (SYNTHROID, LEVOTHROID) 125 MCG tablet Take 125 mcg by mouth daily before breakfast.    [provider]  LINZESS 145 MCG CAPS capsule Take 145 mcg by mouth daily. 10/26/17   [provider]  predniSONE (STERAPRED UNI-PAK 21 TAB) 5 MG (21) TBPK tablet Take 6 pills first day; 5 pills second day; 4 pills third day; 3 pills fourth day; 2 pills next day and 1 pill last day. 11/09/17   Darreld Mclean, MD    Allergies    Morphine and related and Food  Review of Systems   Review of Systems  All other systems reviewed and are negative.   Physical Exam Updated Vital Signs BP (!) 129/97   Pulse (!) 114   Temp 99 F (37.2 C) (Oral)   Resp 17   Ht 5\' 5"  (1.651 m)   Wt 83.9 kg   SpO2 99%   BMI 30.79 kg/m   Physical Exam Vitals and nursing note reviewed.  Constitutional:      General: She is not in acute distress.    Appearance: She is well-developed. She is not diaphoretic.  HENT:     Head: Normocephalic and atraumatic.     Mouth/Throat:     Mouth: Mucous membranes are moist.     Pharynx: No oropharyngeal exudate or posterior oropharyngeal erythema.  Cardiovascular:     Rate and Rhythm: Normal rate and regular rhythm.     Heart sounds: No  murmur heard.  No friction rub. No gallop.   Pulmonary:     Effort: Pulmonary effort is normal. No respiratory distress.     Breath sounds: Normal breath sounds. No wheezing.  Abdominal:     General: Bowel sounds are normal. There is no distension.     Palpations: Abdomen is soft.     Tenderness: There is no abdominal tenderness.  Musculoskeletal:        General: Normal range of motion.     Cervical back: Normal range of motion and neck supple.  Skin:    General: Skin is warm and dry.  Neurological:     Mental Status: She is alert and oriented to person, place, and time.     ED Results / Procedures / Treatments   Labs (all labs ordered are listed, but only abnormal results are displayed) Labs Reviewed  SARS CORONAVIRUS 2 BY RT PCR (HOSPITAL ORDER, PERFORMED IN [facility]) - Abnormal; Notable for the following components:      Result Value   SARS Coronavirus 2 POSITIVE (*)    All other components within normal limits    EKG None  Radiology No results found.  Procedures Procedures (including critical care time)  Medications Ordered in ED Medications  HYDROmorphone (DILAUDID) injection 1 mg (has no administration in time range)    ED Course  I have reviewed the triage vital signs and the nursing notes.  Pertinent labs & imaging results that were available during my care of the patient were reviewed by me and considered in my medical decision making (see chart for details).    MDM Rules/Calculators/A&P  Patient's COVID-19 test is positive.  Her vitals are stable with no hypoxia.  Patient has no comorbidities that put her at risk.  Patient will be discharged with symptomatic care.  She was also given an IM injection of Dilaudid here in the ER to treat her worsening back pain.  To return as needed for any problems.  Final Clinical Impression(s) / ED Diagnoses Final diagnoses:  None    Rx / DC Orders ED Discharge Orders    None       , MD 01/26/20 0028

## 2020-01-26 NOTE — Discharge Instructions (Signed)
Take Tylenol 1000 mg rotated with ibuprofen 600 mg every 4 hours as needed for pain or fever.  Drink plenty of fluids and get plenty of rest.  Return to the emergency department for worsening breathing, severe chest pain, or other new and concerning symptoms.      Person Under Monitoring Name: Ana Duncan  Location: [address] [place] [address]   Infection Prevention Recommendations for Individuals Confirmed to have, or Being Evaluated for, 2019 Novel Coronavirus (COVID-19) Infection Who Receive Care at Home  Individuals who are confirmed to have, or are being evaluated for, COVID-19 should follow the prevention steps below until a healthcare provider or local or state health department says they can return to normal activities.  Stay home except to get medical care You should restrict activities outside your home, except for getting medical care. Do not go to work, school, or public areas, and do not use public transportation or taxis.  Call ahead before visiting your doctor Before your medical appointment, call the healthcare provider and tell them that you have, or are being evaluated for, COVID-19 infection. This will help the healthcare provider's office take steps to keep other people from getting infected. Ask your healthcare provider to call the local or state health department.  Monitor your symptoms Seek prompt medical attention if your illness is worsening (e.g., difficulty breathing). Before going to your medical appointment, call the healthcare provider and tell them that you have, or are being evaluated for, COVID-19 infection. Ask your healthcare provider to call the local or state health department.  Wear a facemask You should wear a facemask that covers your nose and mouth when you are in the same room with other people and when you visit a healthcare provider. People who live with or visit you should also wear a facemask while they are in the same  room with you.  Separate yourself from other people in your home As much as possible, you should stay in a different room from other people in your home. Also, you should use a separate bathroom, if available.  Avoid sharing household items You should not share dishes, drinking glasses, cups, eating utensils, towels, bedding, or other items with other people in your home. After using these items, you should wash them thoroughly with soap and water.  Cover your coughs and sneezes Cover your mouth and nose with a tissue when you cough or sneeze, or you can cough or sneeze into your sleeve. Throw used tissues in a lined trash can, and immediately wash your hands with soap and water for at least 20 seconds or use an alcohol-based hand rub.  Wash your [employer] your hands often and thoroughly with soap and water for at least 20 seconds. You can use an alcohol-based hand sanitizer if soap and water are not available and if your hands are not visibly dirty. Avoid touching your eyes, nose, and mouth with unwashed hands.   Prevention Steps for Caregivers and Household Members of Individuals Confirmed to have, or Being Evaluated for, COVID-19 Infection Being Cared for in the Home  If you live with, or provide care at home for, a person confirmed to have, or being evaluated for, COVID-19 infection please follow these guidelines to prevent infection:  Follow healthcare provider's instructions Make sure that you understand and can help the patient follow any healthcare provider instructions for all care.  Provide for the patient's basic needs You should help the patient with basic needs in  the home and provide support for getting groceries, prescriptions, and other personal needs.  Monitor the patient's symptoms If they are getting sicker, call his or her medical provider and tell them that the patient has, or is being evaluated for, COVID-19 infection. This will help the healthcare  provider's office take steps to keep other people from getting infected. Ask the healthcare provider to call the local or state health department.  Limit the number of people who have contact with the patient If possible, have only one caregiver for the patient. Other household members should stay in another home or place of residence. If this is not possible, they should stay in another room, or be separated from the patient as much as possible. Use a separate bathroom, if available. Restrict visitors who do not have an essential need to be in the home.  Keep older adults, very young children, and other sick people away from the patient Keep older adults, very young children, and those who have compromised immune systems or chronic health conditions away from the patient. This includes people with chronic heart, lung, or kidney conditions, diabetes, and cancer.  Ensure good ventilation Make sure that shared spaces in the home have good air flow, such as from an air conditioner or an opened window, weather permitting.  Wash your hands often Wash your hands often and thoroughly with soap and water for at least 20 seconds. You can use an alcohol based hand sanitizer if soap and water are not available and if your hands are not visibly dirty. Avoid touching your eyes, nose, and mouth with unwashed hands. Use disposable paper towels to dry your hands. If not available, use dedicated cloth towels and replace them when they become wet.  Wear a facemask and gloves Wear a disposable facemask at all times in the room and gloves when you touch or have contact with the patient's blood, body fluids, and/or secretions or excretions, such as sweat, saliva, sputum, nasal mucus, vomit, urine, or feces.  Ensure the mask fits over your nose and mouth tightly, and do not touch it during use. Throw out disposable facemasks and gloves after using them. Do not reuse. Wash your hands immediately after removing your  facemask and gloves. If your personal clothing becomes contaminated, carefully remove clothing and launder. Wash your hands after handling contaminated clothing. Place all used disposable facemasks, gloves, and other waste in a lined container before disposing them with other household waste. Remove gloves and wash your hands immediately after handling these items.  Do not share dishes, glasses, or other household items with the patient Avoid sharing household items. You should not share dishes, drinking glasses, cups, eating utensils, towels, bedding, or other items with a patient who is confirmed to have, or being evaluated for, COVID-19 infection. After the person uses these items, you should wash them thoroughly with soap and water.  Wash laundry thoroughly Immediately remove and wash clothes or bedding that have blood, body fluids, and/or secretions or excretions, such as sweat, saliva, sputum, nasal mucus, vomit, urine, or feces, on them. Wear gloves when handling laundry from the patient. Read and follow directions on labels of laundry or clothing items and detergent. In general, wash and dry with the warmest temperatures recommended on the label.  Clean all areas the individual has used often Clean all touchable surfaces, such as counters, tabletops, doorknobs, bathroom fixtures, toilets, phones, keyboards, tablets, and bedside tables, every day. Also, clean any surfaces that may have blood, body fluids,  and/or secretions or excretions on them. Wear gloves when cleaning surfaces the patient has come in contact with. Use a diluted bleach solution (e.g., dilute bleach with 1 part bleach and 10 parts water) or a household disinfectant with a label that says EPA-registered for coronaviruses. To make a bleach solution at home, add 1 tablespoon of bleach to 1 quart (4 cups) of water. For a larger supply, add  cup of bleach to 1 gallon (16 cups) of water. Read labels of cleaning products and  follow recommendations provided on product labels. Labels contain instructions for safe and effective use of the cleaning product including precautions you should take when applying the product, such as wearing gloves or eye protection and making sure you have good ventilation during use of the product. Remove gloves and wash hands immediately after cleaning.  Monitor yourself for signs and symptoms of illness Caregivers and household members are considered close contacts, should monitor their health, and will be asked to limit movement outside of the home to the extent possible. Follow the monitoring steps for close contacts listed on the symptom monitoring form.   ? If you have additional questions, contact your local health department or call the epidemiologist on call at [phone] (available 24/7). ? This guidance is subject to change. For the most up-to-date guidance from [facility], please refer to their website: [email or web address]

## 2020-05-30 ENCOUNTER — Other Ambulatory Visit: Payer: Self-pay | Admitting: Orthopaedic Surgery

## 2020-05-30 ENCOUNTER — Other Ambulatory Visit: Payer: Self-pay

## 2020-05-30 ENCOUNTER — Ambulatory Visit: Payer: PRIVATE HEALTH INSURANCE

## 2020-05-30 ENCOUNTER — Encounter: Payer: Self-pay | Admitting: Orthopaedic Surgery

## 2020-05-30 ENCOUNTER — Ambulatory Visit (INDEPENDENT_AMBULATORY_CARE_PROVIDER_SITE_OTHER): Payer: PRIVATE HEALTH INSURANCE | Admitting: Orthopaedic Surgery

## 2020-05-30 VITALS — Ht 65.0 in | Wt 195.0 lb

## 2020-05-30 DIAGNOSIS — M5442 Lumbago with sciatica, left side: Secondary | ICD-10-CM

## 2020-05-30 DIAGNOSIS — G8929 Other chronic pain: Secondary | ICD-10-CM

## 2020-05-30 DIAGNOSIS — M545 Low back pain, unspecified: Secondary | ICD-10-CM

## 2020-05-30 MED ORDER — HYDROCODONE-ACETAMINOPHEN 5-325 MG PO TABS: ORAL_TABLET | ORAL | 0 refills | Status: DC

## 2020-05-30 MED ORDER — PREDNISONE 5 MG (21) PO TBPK: ORAL_TABLET | ORAL | 0 refills | Status: DC

## 2020-05-30 MED ORDER — CYCLOBENZAPRINE HCL 10 MG PO TABS: 10.0000 mg | ORAL_TABLET | Freq: Every day | ORAL | 0 refills | Status: DC

## 2020-05-30 NOTE — Progress Notes (Signed)
Patient Ana Duncan, female DOB:05-29-1980, 40 y.o. [id number]  Chief Complaint  Patient presents with  . Back Pain    Chronic back pain.    HPI  STEFANI BAIK is a 40 y.o. female who has lower back pain with left sided paresthesias present for several months.  Nothing helps.  She has tried rest, heat, ice, Advil and Flexeril.  She had Harrington rod surgery when she was 15 from L3 to upper thoracic area.  She did well.  I last saw her in 2019.  She denies any trauma.  She has no weakness.     Body mass index is 32.45 kg/m.  ROS  Review of Systems  Constitutional: Positive for activity change.  Musculoskeletal: Positive for arthralgias and back pain.  All other systems reviewed and are negative.   All other systems reviewed and are negative.  The following is a summary of the past history medically, past history surgically, known current medicines, social history and family history.  This information is gathered electronically by the computer from prior information and documentation.  I review this each visit and have found including this information at this point in the chart is beneficial and informative.    Past Medical History:  Diagnosis Date  . Anemia   . Chronic back pain   . Scoliosis   . Thyroid disease   . Vitamin B 12 deficiency     Past Surgical History:  Procedure Laterality Date  . BACK SURGERY     rod placement for scoliosis  . THYROIDECTOMY  08/26/2011   Procedure: THYROIDECTOMY;  Surgeon: Fabio Bering, MD;  Location: [facility];  Service: General;  Laterality: N/A;  Total Thyroidectomy    Family History  Problem Relation Age of Onset  . Diabetes Mother   . Hypertension Mother   . Lupus Maternal Grandfather   . Anesthesia problems Neg Hx   . Hypotension Neg Hx   . Malignant hyperthermia Neg Hx   . Pseudochol deficiency Neg Hx   . Breast cancer Neg Hx     Social History Social History   Tobacco Use  . Smoking status: Never Smoker   . Smokeless tobacco: Never Used  Substance Use Topics  . Alcohol use: No  . Drug use: Yes    Types: Marijuana    Allergies  Allergen Reactions  . Morphine And Related Other (See Comments)    dizziness  . Food Nausea And Vomiting and Rash    CORN    Current Outpatient Medications  Medication Sig Dispense Refill  . cyclobenzaprine (FLEXERIL) 10 MG tablet Take 1 tablet (10 mg total) by mouth at bedtime. One tablet every night at bedtime as needed for spasm. 30 tablet 0  . HYDROcodone-acetaminophen (NORCO/VICODIN) 5-325 MG tablet One tablet every four hours as needed for acute pain.  Limit of five days per [place] statue. 30 tablet 0  . predniSONE (STERAPRED UNI-PAK 21 TAB) 5 MG (21) TBPK tablet Take 6 pills first day; 5 pills second day; 4 pills third day; 3 pills fourth day; 2 pills next day and 1 pill last day. 21 tablet 0  . Cyanocobalamin (VITAMIN B-12 IJ) Inject 1 each into the muscle every 30 (thirty) days.     . diclofenac (VOLTAREN) 75 MG EC tablet Take 1 tablet (75 mg total) by mouth 2 (two) times daily with a meal. 60 tablet 2  . HYDROcodone-acetaminophen (NORCO/VICODIN) 5-325 MG tablet One tablet every six hours for pain.  Limit 7  days. 28 tablet 0  . levothyroxine (SYNTHROID, LEVOTHROID) 125 MCG tablet Take 125 mcg by mouth daily before breakfast.    . LINZESS 145 MCG CAPS capsule Take 145 mcg by mouth daily.  3   No current facility-administered medications for this visit.     Physical Exam  Height 5\' 5"  (1.651 m), weight 195 lb (88.5 kg).  Constitutional: overall normal hygiene, normal nutrition, well developed, normal grooming, normal body habitus. Assistive device:none  Musculoskeletal: gait and station Limp none, muscle tone and strength are normal, no tremors or atrophy is present.  .  Neurological: coordination overall normal.  Deep tendon reflex/nerve stretch intact.  Sensation normal.  Cranial nerves II-XII intact.   Skin:   Normal overall no scars,  lesions, ulcers or rashes. No psoriasis.  Psychiatric: Alert and oriented x 3.  Recent memory intact, remote memory unclear.  Normal mood and affect. Well groomed.  Good eye contact.  Cardiovascular: overall no swelling, no varicosities, no edema bilaterally, normal temperatures of the legs and arms, no clubbing, cyanosis and good capillary refill.  Lymphatic: palpation is normal.  Spine/Pelvis examination:  Inspection:  Overall, sacoiliac joint benign and hips nontender; without crepitus or defects.   Thoracic spine inspection: Alignment normal without kyphosis present   Lumbar spine inspection:  Alignment  with normal lumbar lordosis, without scoliosis apparent.   Thoracic spine palpation:  without tenderness of spinal processes   Lumbar spine palpation: without tenderness of lumbar area; without tightness of lumbar muscles    Range of Motion:   Lumbar flexion, forward flexion is normal without pain or tenderness    Lumbar extension is full without pain or tenderness   Left lateral bend is normal without pain or tenderness   Right lateral bend is normal without pain or tenderness   Straight leg raising is normal  Strength & tone: normal   Stability overall normal stability  All other systems reviewed and are negative   The patient has been educated about the nature of the problem(s) and counseled on treatment options.  The patient appeared to understand what I have discussed and is in agreement with it.  Encounter Diagnosis  Name Primary?  . Chronic midline low back pain with left-sided sciatica Yes   X-rays were done of the lumbar spine, reported separately.  PLAN Call if any problems.  Precautions discussed.  I will begin prednisone dose pack, pain medicine and Flexeril  Return to clinic 1 week   Consider MRI if not improved.  I have reviewed the Controlled Substance Reporting System web site prior to prescribing narcotic medicine for this  patient.   Electronically Signed [place], MD 1/13/20229:48 AM

## 2020-06-04 ENCOUNTER — Telehealth: Payer: Self-pay | Admitting: Orthopaedic Surgery

## 2020-06-04 NOTE — Telephone Encounter (Signed)
Patient called and stated that the Hydrocodone did not seem to be helping at all.  She wants to know if Dr Hilda Lias can send in something stronger for her?  She uses [facility]

## 2020-06-05 NOTE — Telephone Encounter (Signed)
Too early to refill.  Take some extra Tylenol with it.  Next week I can increase.  Call Tuesday, remind me of increased dose.

## 2020-06-11 ENCOUNTER — Telehealth: Payer: Self-pay | Admitting: Orthopaedic Surgery

## 2020-06-11 MED ORDER — HYDROCODONE-ACETAMINOPHEN 5-325 MG PO TABS: ORAL_TABLET | ORAL | 0 refills | Status: DC

## 2020-06-11 NOTE — Telephone Encounter (Signed)
Patient requests increase on Hydrocodone/Acetaminophen prescription      This was discussed last week  She says she uses [facility]

## 2020-06-12 ENCOUNTER — Telehealth: Payer: Self-pay | Admitting: Orthopaedic Surgery

## 2020-06-12 MED ORDER — HYDROCODONE-ACETAMINOPHEN 7.5-325 MG PO TABS: 1.0000 | ORAL_TABLET | Freq: Four times a day (QID) | ORAL | 0 refills | Status: DC | PRN

## 2020-06-12 NOTE — Telephone Encounter (Signed)
Patient states that she did not pick up prescription that was written yesterday.  She said that the prescription was for the same mgs. as before and this did not help her.    She said there was to have been an increase in the dosage.  Can she get a new prescription for higher dosage sent to [facility]?  Please advise  Thanks

## 2020-06-18 ENCOUNTER — Ambulatory Visit (INDEPENDENT_AMBULATORY_CARE_PROVIDER_SITE_OTHER): Payer: PRIVATE HEALTH INSURANCE | Admitting: Orthopaedic Surgery

## 2020-06-18 ENCOUNTER — Other Ambulatory Visit: Payer: Self-pay

## 2020-06-18 ENCOUNTER — Encounter: Payer: Self-pay | Admitting: Orthopaedic Surgery

## 2020-06-18 VITALS — BP 129/94 | HR 98 | Ht 65.0 in | Wt 195.0 lb

## 2020-06-18 DIAGNOSIS — M5442 Lumbago with sciatica, left side: Secondary | ICD-10-CM

## 2020-06-18 DIAGNOSIS — G8929 Other chronic pain: Secondary | ICD-10-CM | POA: Diagnosis not present

## 2020-06-18 MED ORDER — HYDROCODONE-ACETAMINOPHEN 7.5-325 MG PO TABS: 1.0000 | ORAL_TABLET | Freq: Four times a day (QID) | ORAL | 0 refills | Status: AC | PRN

## 2020-06-18 NOTE — Progress Notes (Signed)
Patient [name], female DOB:1981/05/05, 40 y.o. [id number]  Chief Complaint  Patient presents with  . Back Pain    HPI  Ana Duncan is a 40 y.o. female who has lower back pain with left sided sciatica that is worse. She has been on prednisone dose pack and pain medicine and is not better.  She is post Harrington Rod procedure in past.  I am concerned about HNP.  I will get MRI of the lumbar spine.  I will refill her pain medicine.   Body mass index is 32.45 kg/m.  ROS  Review of Systems  Constitutional: Positive for activity change.  Musculoskeletal: Positive for arthralgias and back pain.  All other systems reviewed and are negative.   All other systems reviewed and are negative.  The following is a summary of the past history medically, past history surgically, known current medicines, social history and family history.  This information is gathered electronically by the computer from prior information and documentation.  I review this each visit and have found including this information at this point in the chart is beneficial and informative.    Past Medical History:  Diagnosis Date  . Anemia   . Chronic back pain   . Scoliosis   . Thyroid disease   . Vitamin B 12 deficiency     Past Surgical History:  Procedure Laterality Date  . BACK SURGERY     rod placement for scoliosis  . THYROIDECTOMY  08/26/2011   Procedure: THYROIDECTOMY;  Surgeon: Fabio Bering, MD;  Location: [facility];  Service: General;  Laterality: N/A;  Total Thyroidectomy    Family History  Problem Relation Age of Onset  . Diabetes Mother   . Hypertension Mother   . Lupus Maternal Grandfather   . Anesthesia problems Neg Hx   . Hypotension Neg Hx   . Malignant hyperthermia Neg Hx   . Pseudochol deficiency Neg Hx   . Breast cancer Neg Hx     Social History Social History   Tobacco Use  . Smoking status: Never Smoker  . Smokeless tobacco: Never Used  Substance Use Topics  .  Alcohol use: No  . Drug use: Yes    Types: Marijuana    Allergies  Allergen Reactions  . Corn Oil Itching  . Morphine And Related Other (See Comments)    dizziness  . Food Nausea And Vomiting and Rash    CORN    Current Outpatient Medications  Medication Sig Dispense Refill  . Cyanocobalamin (VITAMIN B-12 IJ) Inject 1 each into the muscle every 30 (thirty) days.     . cyclobenzaprine (FLEXERIL) 10 MG tablet Take 1 tablet (10 mg total) by mouth at bedtime. One tablet every night at bedtime as needed for spasm. 30 tablet 0  . HYDROcodone-acetaminophen (NORCO) 7.5-325 MG tablet Take 1 tablet by mouth every 6 (six) hours as needed for up to 7 days. One every six hours as needed for pain.  Seven day limit 28 tablet 0  . levothyroxine (SYNTHROID, LEVOTHROID) 125 MCG tablet Take 125 mcg by mouth daily before breakfast.    . LINZESS 145 MCG CAPS capsule Take 145 mcg by mouth daily.  3  . medroxyPROGESTERone (DEPO-PROVERA) 150 MG/ML injection Inject 150 mg into the muscle every 3 (three) months.     No current facility-administered medications for this visit.     Physical Exam  Blood pressure (!) 129/94, pulse 98, height 5\' 5"  (1.651 m), weight 195 lb (88.5 kg).  Constitutional: overall normal hygiene, normal nutrition, well developed, normal grooming, normal body habitus. Assistive device:none  Musculoskeletal: gait and station Limp none, muscle tone and strength are normal, no tremors or atrophy is present.  .  Neurological: coordination overall normal.  Deep tendon reflex/nerve stretch intact.  Sensation normal.  Cranial nerves II-XII intact.   Skin:   Normal overall no scars, lesions, ulcers or rashes. No psoriasis.  Psychiatric: Alert and oriented x 3.  Recent memory intact, remote memory unclear.  Normal mood and affect. Well groomed.  Good eye contact.  Cardiovascular: overall no swelling, no varicosities, no edema bilaterally, normal temperatures of the legs and arms, no  clubbing, cyanosis and good capillary refill.  Lymphatic: palpation is normal.  Spine/Pelvis examination:  Inspection:  Overall, sacoiliac joint benign and hips nontender; without crepitus or defects.   Thoracic spine inspection: Alignment normal without kyphosis present   Lumbar spine inspection:  Alignment  with normal lumbar lordosis, without scoliosis apparent.   Thoracic spine palpation:  without tenderness of spinal processes   Lumbar spine palpation: without tenderness of lumbar area; without tightness of lumbar muscles    Range of Motion:   Lumbar flexion, forward flexion is normal without pain or tenderness    Lumbar extension is full without pain or tenderness   Left lateral bend is normal without pain or tenderness   Right lateral bend is normal without pain or tenderness   Straight leg raising is normal  Strength & tone: normal   Stability overall normal stability  All other systems reviewed and are negative   The patient has been educated about the nature of the problem(s) and counseled on treatment options.  The patient appeared to understand what I have discussed and is in agreement with it.  Encounter Diagnosis  Name Primary?  . Chronic midline low back pain with left-sided sciatica Yes    PLAN Call if any problems.  Precautions discussed.  Continue current medications.   Return to clinic 2 weeks   I have reviewed the [facility] Controlled Substance Reporting System web site prior to prescribing narcotic medicine for this patient.   Get MRI of the lumbar spine with and without contrast.  Electronically Signed Darreld Mclean, MD 2/1/202211:07 AM

## 2020-07-02 ENCOUNTER — Ambulatory Visit (HOSPITAL_COMMUNITY): Payer: PRIVATE HEALTH INSURANCE

## 2020-07-02 ENCOUNTER — Ambulatory Visit: Payer: PRIVATE HEALTH INSURANCE | Admitting: Orthopaedic Surgery

## 2020-07-04 ENCOUNTER — Ambulatory Visit (INDEPENDENT_AMBULATORY_CARE_PROVIDER_SITE_OTHER): Payer: PRIVATE HEALTH INSURANCE | Admitting: Orthopaedic Surgery

## 2020-07-04 ENCOUNTER — Other Ambulatory Visit: Payer: Self-pay

## 2020-07-04 ENCOUNTER — Telehealth: Payer: Self-pay | Admitting: Radiology

## 2020-07-04 ENCOUNTER — Encounter: Payer: Self-pay | Admitting: Orthopaedic Surgery

## 2020-07-04 VITALS — BP 134/84 | HR 99 | Ht 65.0 in | Wt 196.0 lb

## 2020-07-04 DIAGNOSIS — G8929 Other chronic pain: Secondary | ICD-10-CM

## 2020-07-04 DIAGNOSIS — M5442 Lumbago with sciatica, left side: Secondary | ICD-10-CM

## 2020-07-04 MED ORDER — CYCLOBENZAPRINE HCL 10 MG PO TABS: 10.0000 mg | ORAL_TABLET | Freq: Every day | ORAL | 0 refills | Status: DC

## 2020-07-04 NOTE — Progress Notes (Signed)
She could not get MRI as her deductible is so very high.  I have told her to contact [employer] and other programs like that.  I will see her after she finds out what she can do.  No treatment.  No charge.  Electronically Signed Darreld Mclean, MD 2/17/202210:04 AM

## 2020-07-04 NOTE — Telephone Encounter (Signed)
Patient asked at check out for a refill flexeril.

## 2020-08-02 ENCOUNTER — Telehealth: Payer: Self-pay | Admitting: Orthopaedic Surgery

## 2020-08-02 NOTE — Telephone Encounter (Signed)
Rx request 

## 2020-08-06 ENCOUNTER — Telehealth: Payer: Self-pay | Admitting: Orthopaedic Surgery

## 2020-08-06 MED ORDER — HYDROCODONE-ACETAMINOPHEN 5-325 MG PO TABS: ORAL_TABLET | ORAL | 0 refills | Status: AC

## 2020-08-06 NOTE — Telephone Encounter (Signed)
Patient requests refill on Hydrocodone/Acetaminophen 7.5-325 mgs.  Qty  28  Sig: Take 1 tablet by mouth every 6 (six) hours as needed for up to 7 days. One every six hours as needed for pain. Seven day limit  Patient uses [facility]

## 2020-08-12 NOTE — Telephone Encounter (Signed)
Patient called back to relay that she has called previously to relay that she takes Hydrocodone/Acetaminophen 7.5-325 mgs/quantity 28,  and that she does not understand why the Hydrocodone 5/325 mgs keeps being ordered. States she has been refusing the lesser strength at the pharmacy, as she said Dr Hilda Lias is aware that the 5/325 does not help with the pain. Please advise.

## 2020-08-15 ENCOUNTER — Telehealth: Payer: Self-pay

## 2020-08-15 NOTE — Telephone Encounter (Signed)
Patient called asking if she can be referred to pain management. She has not picked up her last prescription because it has been changed from 7.5/325mg  to 5/325mg . I explained to her that the doctors are cutting the milligrams and quantity of tablets because they are being told to due to the opoid abuse issues. I told her to call the drug store and see if they still had her prescription and if not to let me know, and I would see if you would change back to 7.5/325 or not and if we can go ahead and refer her to pain management.

## 2020-08-19 ENCOUNTER — Telehealth: Payer: Self-pay | Admitting: Orthopaedic Surgery

## 2020-08-19 NOTE — Telephone Encounter (Signed)
Lalla called and asked to have you call her back when you have a minute please

## 2020-08-19 NOTE — Telephone Encounter (Signed)
Returned her call and had to leave a message. She may call back.

## 2020-08-20 ENCOUNTER — Telehealth: Payer: Self-pay

## 2020-08-20 DIAGNOSIS — M5442 Lumbago with sciatica, left side: Secondary | ICD-10-CM

## 2020-08-20 DIAGNOSIS — G8929 Other chronic pain: Secondary | ICD-10-CM

## 2020-08-20 NOTE — Telephone Encounter (Signed)
Patient called asking if she can be referred to pain management. She has not picked up her last prescription because it has been changed from 7.5/325mg  to 5/325mg . I explained to her that the doctors are cutting the milligrams and quantity of tablets because they are being told to due to the opoid abuse issues. I told her to call the drug store and see if they still had her prescription and if not to let me know, and I would see if you would change back to 7.5/325 or not and if we can go ahead and refer her to pain management.  Please advise

## 2020-08-21 NOTE — Telephone Encounter (Signed)
OK.  Set up pain management.

## 2020-08-26 NOTE — Telephone Encounter (Signed)
Referral has been entered.

## 2020-09-12 ENCOUNTER — Telehealth: Payer: Self-pay

## 2020-09-12 NOTE — Telephone Encounter (Signed)
Patient called stating that [facility] is not in her network. Stated the 5/325 mg doesn't do anything for the pain. She is checking with her insurance to find out what pain clinic is in their network.    Hydrocodone-Acetaminophen   Qty 28 Tablets  PATIENT USES [facility]

## 2020-09-12 NOTE — Telephone Encounter (Signed)
Requesting refill   Flexeril 10 MG Qty 30 Tablets  PATIENT USES [facility]

## 2020-09-16 NOTE — Telephone Encounter (Signed)
OK.  But I cannot increase the pain medicine.  Find out what is in her network.  After you find out which one she will be able to go, then set up appointment.  I can do pain medicine after all that to get her by until seen.

## 2020-09-30 ENCOUNTER — Other Ambulatory Visit: Payer: Self-pay | Admitting: Orthopaedic Surgery

## 2020-09-30 DIAGNOSIS — G8929 Other chronic pain: Secondary | ICD-10-CM

## 2020-10-02 ENCOUNTER — Telehealth: Payer: Self-pay | Admitting: Orthopaedic Surgery

## 2020-10-02 NOTE — Telephone Encounter (Signed)
Ana Duncan states she has the fax number to [facility] as [phone] for you to send her referral  She said [facility] does not take her insurance.  Please fax this to them for her.  Thanks

## 2020-10-03 NOTE — Telephone Encounter (Signed)
I have faxed all the necessary paperwork to [facility] for Ana Duncan.

## 2020-10-10 ENCOUNTER — Other Ambulatory Visit: Payer: Self-pay | Admitting: Orthopaedic Surgery

## 2020-10-30 ENCOUNTER — Telehealth: Payer: Self-pay | Admitting: Radiology

## 2020-10-30 NOTE — Telephone Encounter (Signed)
There is an open referral for patient for pain management  The comments from earlier this week indicate patient may need

## 2020-10-31 ENCOUNTER — Telehealth: Payer: Self-pay

## 2020-10-31 NOTE — Telephone Encounter (Signed)
Per Dr. Hilda Lias I spoke with patient and was told that she would check into discount. She doesn't understand why the pain clinic wants her to do a MRI. Stated that she would give [place] a call back after speaking to someone about the discount.

## 2020-10-31 NOTE — Telephone Encounter (Signed)
Spoke with patient and she didn't understand why a pain clinic would need for her to have a MRI. Stated that she has had some numbness for about 2 months, now and I asked if she want to come in to be seen and she stated not at this time.She is still not interested in doing a MRI because she doesn't plan on any surgery(per patient). She will check into the discount, but was told by family member that it was for people with no money. I told her that she should call and see what they tell her and then give me a call to let me know what she found out.

## 2022-08-18 ENCOUNTER — Emergency Department (HOSPITAL_COMMUNITY): Payer: PRIVATE HEALTH INSURANCE

## 2022-08-18 ENCOUNTER — Other Ambulatory Visit: Payer: Self-pay

## 2022-08-18 ENCOUNTER — Encounter (HOSPITAL_COMMUNITY): Payer: Self-pay

## 2022-08-18 ENCOUNTER — Emergency Department (HOSPITAL_COMMUNITY)
Admission: EM | Admit: 2022-08-18 | Discharge: 2022-08-18 | Disposition: A | Discharge status: Home / Self Care | Payer: PRIVATE HEALTH INSURANCE | Attending: Emergency Medicine | Admitting: Emergency Medicine

## 2022-08-18 DIAGNOSIS — D72818 Other decreased white blood cell count: Secondary | ICD-10-CM | POA: Insufficient documentation

## 2022-08-18 DIAGNOSIS — J069 Acute upper respiratory infection, unspecified: Secondary | ICD-10-CM | POA: Insufficient documentation

## 2022-08-18 DIAGNOSIS — J45909 Unspecified asthma, uncomplicated: Secondary | ICD-10-CM | POA: Insufficient documentation

## 2022-08-18 DIAGNOSIS — R059 Cough, unspecified: Secondary | ICD-10-CM | POA: Diagnosis present

## 2022-08-18 DIAGNOSIS — Z1152 Encounter for screening for COVID-19: Secondary | ICD-10-CM | POA: Diagnosis not present

## 2022-08-18 LAB — URINALYSIS, ROUTINE W REFLEX MICROSCOPIC
Bilirubin Urine: NEGATIVE
Glucose, UA: NEGATIVE mg/dL
Ketones, ur: NEGATIVE mg/dL
Nitrite: NEGATIVE
Protein, ur: NEGATIVE mg/dL
Specific Gravity, Urine: 1.019 (ref 1.005–1.030)
pH: 6 (ref 5.0–8.0)

## 2022-08-18 LAB — CBC WITH DIFFERENTIAL/PLATELET
Abs Immature Granulocytes: 0.01 10*3/uL (ref 0.00–0.07)
Basophils Absolute: 0 10*3/uL (ref 0.0–0.1)
Basophils Relative: 1 %
Eosinophils Absolute: 0 10*3/uL (ref 0.0–0.5)
Eosinophils Relative: 1 %
HCT: 41.2 % (ref 36.0–46.0)
Hemoglobin: 13.4 g/dL (ref 12.0–15.0)
Immature Granulocytes: 0 %
Lymphocytes Relative: 41 %
Lymphs Abs: 1.3 10*3/uL (ref 0.7–4.0)
MCH: 28.5 pg (ref 26.0–34.0)
MCHC: 32.5 g/dL (ref 30.0–36.0)
MCV: 87.5 fL (ref 80.0–100.0)
Monocytes Absolute: 0.4 10*3/uL (ref 0.1–1.0)
Monocytes Relative: 12 %
Neutro Abs: 1.5 10*3/uL — ABNORMAL LOW (ref 1.7–7.7)
Neutrophils Relative %: 45 %
Platelets: 261 10*3/uL (ref 150–400)
RBC: 4.71 MIL/uL (ref 3.87–5.11)
RDW: 12.6 % (ref 11.5–15.5)
WBC: 3.2 10*3/uL — ABNORMAL LOW (ref 4.0–10.5)
nRBC: 0 % (ref 0.0–0.2)

## 2022-08-18 LAB — COMPREHENSIVE METABOLIC PANEL
ALT: 15 U/L (ref 0–44)
AST: 18 U/L (ref 15–41)
Albumin: 4.3 g/dL (ref 3.5–5.0)
Alkaline Phosphatase: 70 U/L (ref 38–126)
Anion gap: 9 (ref 5–15)
BUN: 8 mg/dL (ref 6–20)
CO2: 23 mmol/L (ref 22–32)
Calcium: 8.5 mg/dL — ABNORMAL LOW (ref 8.9–10.3)
Chloride: 106 mmol/L (ref 98–111)
Creatinine, Ser: 0.88 mg/dL (ref 0.44–1.00)
GFR, Estimated: >60 mL/min (ref >60)
Glucose, Bld: 103 mg/dL — ABNORMAL HIGH (ref 70–99)
Potassium: 3.4 mmol/L — ABNORMAL LOW (ref 3.5–5.1)
Sodium: 138 mmol/L (ref 135–145)
Total Bilirubin: 0.5 mg/dL (ref 0.3–1.2)
Total Protein: 7.5 g/dL (ref 6.5–8.1)

## 2022-08-18 LAB — PREGNANCY, URINE: Preg Test, Ur: NEGATIVE

## 2022-08-18 LAB — SARS CORONAVIRUS 2 BY RT PCR: SARS Coronavirus 2 by RT PCR: NEGATIVE

## 2022-08-18 MED ORDER — ALBUTEROL SULFATE HFA 108 (90 BASE) MCG/ACT IN AERS: 1.0000 | INHALATION_SPRAY | Freq: Four times a day (QID) | RESPIRATORY_TRACT | 2 refills | Status: AC | PRN

## 2022-08-18 MED ORDER — BENZONATATE 100 MG PO CAPS
200.0000 mg | ORAL_CAPSULE | Freq: Once | ORAL | Status: AC
  Administered 2022-08-18: 200 mg via ORAL
  Filled 2022-08-18: qty 2

## 2022-08-18 MED ORDER — BENZONATATE 100 MG PO CAPS: 100.0000 mg | ORAL_CAPSULE | Freq: Three times a day (TID) | ORAL | 0 refills | Status: AC

## 2022-08-18 MED ORDER — ALBUTEROL SULFATE (2.5 MG/3ML) 0.083% IN NEBU
2.5000 mg | INHALATION_SOLUTION | Freq: Once | RESPIRATORY_TRACT | Status: AC
  Administered 2022-08-18: 2.5 mg via RESPIRATORY_TRACT
  Filled 2022-08-18: qty 3

## 2022-08-18 NOTE — ED Provider Notes (Signed)
[facility] Provider Note   CSN: [id number] Arrival date & time: 08/18/22  1200     History Chief Complaint  Patient presents with   Cough    Ana Duncan is a 42 y.o. female.  Patient presents emergency department complaints of cough, nasal drainage, fatigue and lightheadedness for the last 2 days or so.  She reports that she has no known sick contacts recently.  She is currently fasting for Ramadan and believes that she may be lightheaded due to this but she reports that she is attempting to eat and drink sufficient in the evenings.  She denies any significant shortness of breath or chest pain with this.  She has a prior history significant for asthma that she feels may have been somewhat flared up with this current bout of illness.  She has not taken any inhalers to try to help with her work of breathing.   Cough Associated symptoms: fever        Home Medications Prior to Admission medications   Medication Sig Start Date End Date Taking? Authorizing Provider  albuterol (VENTOLIN HFA) 108 (90 Base) MCG/ACT inhaler Inhale 1-2 puffs into the lungs every 6 (six) hours as needed for wheezing or shortness of breath. 08/18/22  Yes Luvenia Heller, PA-C  benzonatate (TESSALON) 100 MG capsule Take 1 capsule (100 mg total) by mouth every 8 (eight) hours. 08/18/22  Yes Luvenia Heller, PA-C  Cyanocobalamin (VITAMIN B-12 IJ) Inject 1 each into the muscle every 30 (thirty) days.     [provider]  cyclobenzaprine (FLEXERIL) 10 MG tablet TAKE 1 TABLET BY MOUTH ONCE DAILY AT NIGHT AT BEDTIME AS NEEDED FOR  SPASM. 08/06/20   Sanjuana Kava, MD  HYDROcodone-acetaminophen (NORCO/VICODIN) 5-325 MG tablet One tablet every six hours for pain.  Limit 7 days. 08/06/20   Sanjuana Kava, MD  levothyroxine (SYNTHROID, LEVOTHROID) 125 MCG tablet Take 125 mcg by mouth daily before breakfast.    [provider]  LINZESS 145 MCG CAPS capsule Take 145  mcg by mouth daily. 10/26/17   [provider]  medroxyPROGESTERone (DEPO-PROVERA) 150 MG/ML injection Inject 150 mg into the muscle every 3 (three) months. 03/28/20   [provider]      Allergies    Corn oil, Morphine and related, and Food    Review of Systems   Review of Systems  Constitutional:  Positive for fever.  Respiratory:  Positive for cough.   All other systems reviewed and are negative.   Physical Exam Updated Vital Signs BP (!) 142/81 (BP Location: Right Arm)   Pulse 96   Temp (!) 101.3 F (38.5 C) (Oral)   Resp 20   Ht 5\' 5"  (1.651 m)   Wt 81.6 kg   SpO2 97%   BMI 29.95 kg/m  Physical Exam Vitals and nursing note reviewed.  Constitutional:      General: She is not in acute distress.    Appearance: She is well-developed.  HENT:     Head: Normocephalic and atraumatic.  Eyes:     Conjunctiva/sclera: Conjunctivae normal.  Cardiovascular:     Rate and Rhythm: Normal rate and regular rhythm.     Heart sounds: No murmur heard. Pulmonary:     Effort: Pulmonary effort is normal. No respiratory distress.     Breath sounds: No wheezing or rales.  Abdominal:     Palpations: Abdomen is soft.     Tenderness: There is no abdominal tenderness.  Musculoskeletal:        General: No swelling.     Cervical back: Neck supple.  Skin:    General: Skin is warm and dry.     Capillary Refill: Capillary refill takes less than 2 seconds.  Neurological:     Mental Status: She is alert.  Psychiatric:        Mood and Affect: Mood normal.     ED Results / Procedures / Treatments   Labs (all labs ordered are listed, but only abnormal results are displayed) Labs Reviewed  COMPREHENSIVE METABOLIC PANEL - Abnormal; Notable for the following components:      Result Value   Potassium 3.4 (*)    Glucose, Bld 103 (*)    Calcium 8.5 (*)    All other components within normal limits  CBC WITH DIFFERENTIAL/PLATELET - Abnormal; Notable for the following  components:   WBC 3.2 (*)    Neutro Abs 1.5 (*)    All other components within normal limits  URINALYSIS, ROUTINE W REFLEX MICROSCOPIC - Abnormal; Notable for the following components:   Hgb urine dipstick SMALL (*)    Leukocytes,Ua SMALL (*)    Bacteria, UA RARE (*)    All other components within normal limits  SARS CORONAVIRUS 2 BY RT PCR  PREGNANCY, URINE    EKG None  Radiology DG Chest 2 View  Result Date: 08/18/2022 CLINICAL DATA:  Cough, chest pain EXAM: CHEST - 2 VIEW COMPARISON:  11/01/2012 FINDINGS: Cardiac size is within normal limits. There are no signs of pulmonary edema or focal pulmonary consolidation. There is no pleural effusion or pneumothorax. Dextroscoliosis is seen in thoracic spine. Levoscoliosis is seen in lumbar spine. Harrington rods are noted in thoracic and lumbar spine. Surgical clips are seen in lower neck. IMPRESSION: No active cardiopulmonary disease. Electronically Signed   By: Elmer Picker M.D.   On: 08/18/2022 13:10    Procedures Procedures   Medications Ordered in ED Medications  benzonatate (TESSALON) capsule 200 mg (200 mg Oral Given 08/18/22 1443)  albuterol (PROVENTIL) (2.5 MG/3ML) 0.083% nebulizer solution 2.5 mg (2.5 mg Nebulization Given 08/18/22 1445)    ED Course/ Medical Decision Making/ A&P                           Medical Decision Making Amount and/or Complexity of Data Reviewed Labs: ordered. Radiology: ordered.  Risk Prescription drug management.   This patient presents to the ED for concern of cough. Differential diagnosis includes bronchitis, pneumonia, viral URI, allergies  Lab Tests:  I Ordered, and personally interpreted labs.  The pertinent results include: Mild leukopenia 3.2, labs otherwise normal.  COVID-negative, urine pregnancy negative, urinalysis shows evidence of a UTI but patient is asymptomatic at this time   Imaging Studies ordered:  I ordered imaging studies including chest x-ray I independently  visualized and interpreted imaging which showed no acute cardiopulmonary disease I agree with the radiologist interpretation   Medicines ordered and prescription drug management:  I ordered medication including Tessalon, albuterol for cough, shortness of breath  Reevaluation of the patient after these medicines showed that the patient improved I have reviewed the patients home medicines and have made adjustments as needed   Problem List / ED Course:  Patient presented to the ED for cough. She reports associated nasal congestion, fatigue, and fevers but is unsure of sick contacts. Workup largely normal with negative COVID, CBC and CMP normal, negative urine pregnancy. UA shows potentially  an infection with blood, leukocytes, and some bacteria. However, patient is currently asymptomatic with regards to urinary symptoms so this is most likely not a UTI. Patient is most likely experiencing a viral URI with a cough. Tessalon and albuterol administered here in the ED with significant improvement in symptoms so prescription for these medications has been sent to patient's pharmacy. Discussed return precautions. Patient agreeable with plan to discharge home and verbalized understanding all return precautions. All questions answered prior to patient discharge.  Final Clinical Impression(s) / ED Diagnoses Final diagnoses:  Viral URI with cough    Rx / DC Orders ED Discharge Orders          Ordered    benzonatate (TESSALON) 100 MG capsule  Every 8 hours        08/18/22 1510    albuterol (VENTOLIN HFA) 108 (90 Base) MCG/ACT inhaler  Every 6 hours PRN        08/18/22 1511              Luvenia Heller, PA-C 08/19/22 0006    Godfrey Pick, MD 08/19/22 1655

## 2022-08-18 NOTE — Discharge Instructions (Addendum)
You were seen in the emergency department for a cough. Thankfully your labs and imaging were reassuring and normal so I believe that you most likely have a viral upper respiratory infection that will take a bit longer to work through. Manage your symptoms at home as best as you can with Tylenol, Ibuprofen, albuterol, Mucinex, and cough medicine as needed. If you develop severe chest pain or shortness of breath, please return to the emergency department.

## 2022-08-18 NOTE — ED Triage Notes (Signed)
Pt presents with cough, nasal drainage, fatigue, and lightheadedness that started 2 days ago. Pt reports that she has been fasting for Ramadan. Pt denies sick contacts.

## 2022-08-19 NOTE — ED Provider Notes (Incomplete)
[facility] Provider Note   CSN: [id number] Arrival date & time: 08/18/22  1200     History Chief Complaint  Patient presents with  . Cough    Ana Duncan is a 42 y.o. female.  Patient presents emergency department complaints of cough, nasal drainage, fatigue and lightheadedness for the last 2 days or so.  She reports that she has no known sick contacts recently.  She is currently fasting for Ramadan and believes that she may be lightheaded due to this but she reports that she is attempting to eat and drink sufficient in the evenings.  She denies any significant shortness of breath or chest pain with this.  She has a prior history significant for asthma that she feels may have been somewhat flared up with this current bout of illness.  She has not taken any inhalers to try to help with her work of breathing.   Cough      Home Medications Prior to Admission medications   Medication Sig Start Date End Date Taking? Authorizing Provider  Cyanocobalamin (VITAMIN B-12 IJ) Inject 1 each into the muscle every 30 (thirty) days.     [provider]  cyclobenzaprine (FLEXERIL) 10 MG tablet TAKE 1 TABLET BY MOUTH ONCE DAILY AT NIGHT AT BEDTIME AS NEEDED FOR  SPASM. 08/06/20   Sanjuana Kava, MD  HYDROcodone-acetaminophen (NORCO/VICODIN) 5-325 MG tablet One tablet every six hours for pain.  Limit 7 days. 08/06/20   Sanjuana Kava, MD  levothyroxine (SYNTHROID, LEVOTHROID) 125 MCG tablet Take 125 mcg by mouth daily before breakfast.    [provider]  LINZESS 145 MCG CAPS capsule Take 145 mcg by mouth daily. 10/26/17   [provider]  medroxyPROGESTERone (DEPO-PROVERA) 150 MG/ML injection Inject 150 mg into the muscle every 3 (three) months. 03/28/20   [provider]      Allergies    Corn oil, Morphine and related, and Food    Review of Systems   Review of Systems  Respiratory:  Positive for cough.      Physical Exam Updated Vital Signs BP 138/89 (BP Location: Right Arm)   Pulse 97   Temp 97.7 F (36.5 C) (Oral)   Resp 18   Ht 5\' 5"  (1.651 m)   Wt 81.6 kg   SpO2 100%   BMI 29.95 kg/m  Physical Exam  ED Results / Procedures / Treatments   Labs (all labs ordered are listed, but only abnormal results are displayed) Labs Reviewed  COMPREHENSIVE METABOLIC PANEL - Abnormal; Notable for the following components:      Result Value   Potassium 3.4 (*)    Glucose, Bld 103 (*)    Calcium 8.5 (*)    All other components within normal limits  CBC WITH DIFFERENTIAL/PLATELET - Abnormal; Notable for the following components:   WBC 3.2 (*)    Neutro Abs 1.5 (*)    All other components within normal limits  URINALYSIS, ROUTINE W REFLEX MICROSCOPIC - Abnormal; Notable for the following components:   Hgb urine dipstick SMALL (*)    Leukocytes,Ua SMALL (*)    Bacteria, UA RARE (*)    All other components within normal limits  SARS CORONAVIRUS 2 BY RT PCR  PREGNANCY, URINE    EKG None  Radiology DG Chest 2 View  Result Date: 08/18/2022 CLINICAL DATA:  Cough, chest pain EXAM: CHEST - 2 VIEW COMPARISON:  11/01/2012 FINDINGS: Cardiac size is within normal limits. There are  no signs of pulmonary edema or focal pulmonary consolidation. There is no pleural effusion or pneumothorax. Dextroscoliosis is seen in thoracic spine. Levoscoliosis is seen in lumbar spine. Harrington rods are noted in thoracic and lumbar spine. Surgical clips are seen in lower neck. IMPRESSION: No active cardiopulmonary disease. Electronically Signed   By: Elmer Picker M.D.   On: 08/18/2022 13:10    Procedures Procedures   Medications Ordered in ED Medications  benzonatate (TESSALON) capsule 200 mg (has no administration in time range)  albuterol (PROVENTIL) (2.5 MG/3ML) 0.083% nebulizer solution 2.5 mg (has no administration in time range)    ED Course/ Medical Decision Making/ A&P                            Medical Decision Making Amount and/or Complexity of Data Reviewed Labs: ordered. Radiology: ordered.  Risk Prescription drug management.   This patient presents to the ED for concern of cough. Differential diagnosis includes bronchitis, pneumonia, viral URI, allergies  Lab Tests:  I Ordered, and personally interpreted labs.  The pertinent results include: Mild leukopenia 3.2, labs otherwise normal.  COVID-negative, urine pregnancy negative, urinalysis shows evidence of a UTI but patient is asymptomatic at this time   Imaging Studies ordered:  I ordered imaging studies including chest x-ray I independently visualized and interpreted imaging which showed no acute cardiopulmonary disease I agree with the radiologist interpretation   Medicines ordered and prescription drug management:  I ordered medication including Tessalon, albuterol for cough, shortness of breath  Reevaluation of the patient after these medicines showed that the patient improved I have reviewed the patients home medicines and have made adjustments as needed   Problem List / ED Course:  Patient presented to the ED for cough. She reports associated nasal congestion, fatigue, and fevers but is unsure of sick contacts.    Social Determinants of Health:     Final Clinical Impression(s) / ED Diagnoses Final diagnoses:  None    Rx / DC Orders ED Discharge Orders     None
